# Patient Record
Sex: Male | Born: 1960 | Race: Black or African American | Hispanic: No | Marital: Married | Smoking: Never smoker
Health system: Southern US, Community
[De-identification: ages and names within clinical notes are randomized; demographics above are authoritative.]

## PROBLEM LIST (undated history)

## (undated) DIAGNOSIS — J45909 Unspecified asthma, uncomplicated: Secondary | ICD-10-CM

## (undated) DIAGNOSIS — T4145XA Adverse effect of unspecified anesthetic, initial encounter: Secondary | ICD-10-CM

## (undated) DIAGNOSIS — M1990 Unspecified osteoarthritis, unspecified site: Secondary | ICD-10-CM

## (undated) DIAGNOSIS — K219 Gastro-esophageal reflux disease without esophagitis: Secondary | ICD-10-CM

## (undated) DIAGNOSIS — G4730 Sleep apnea, unspecified: Secondary | ICD-10-CM

## (undated) DIAGNOSIS — T8859XA Other complications of anesthesia, initial encounter: Secondary | ICD-10-CM

## (undated) HISTORY — PX: NASAL SEPTUM SURGERY: SHX37

## (undated) HISTORY — PX: TONSILLECTOMY: SUR1361

## (undated) HISTORY — PX: ROTATOR CUFF REPAIR: SHX139

## (undated) HISTORY — PX: GREAT TOE ARTHRODESIS, INTERPHALANGEAL JOINT: SUR55

## (undated) HISTORY — PX: KNEE ARTHROSCOPY: SHX127

## (undated) HISTORY — PX: UVULOPALATOPHARYNGOPLASTY: SHX827

---

## 2011-12-27 ENCOUNTER — Emergency Department (HOSPITAL_BASED_OUTPATIENT_CLINIC_OR_DEPARTMENT_OTHER)
Admission: EM | Admit: 2011-12-27 | Discharge: 2011-12-27 | Disposition: A | Discharge status: Home / Self Care | Payer: Federal, State, Local not specified - PPO | Attending: Emergency Medicine | Admitting: Emergency Medicine

## 2011-12-27 ENCOUNTER — Encounter (HOSPITAL_BASED_OUTPATIENT_CLINIC_OR_DEPARTMENT_OTHER): Payer: Self-pay | Admitting: Student

## 2011-12-27 DIAGNOSIS — L03039 Cellulitis of unspecified toe: Secondary | ICD-10-CM | POA: Insufficient documentation

## 2011-12-27 DIAGNOSIS — L02619 Cutaneous abscess of unspecified foot: Secondary | ICD-10-CM | POA: Insufficient documentation

## 2011-12-27 DIAGNOSIS — Z79899 Other long term (current) drug therapy: Secondary | ICD-10-CM | POA: Insufficient documentation

## 2011-12-27 MED ORDER — SULFAMETHOXAZOLE-TRIMETHOPRIM 800-160 MG PO TABS: 1.0000 | ORAL_TABLET | Freq: Two times a day (BID) | ORAL | Status: DC

## 2011-12-27 MED ORDER — LIDOCAINE HCL (PF) 1 % IJ SOLN
INTRAMUSCULAR | Status: AC
  Administered 2011-12-27: 19:00:00
  Filled 2011-12-27: qty 5

## 2011-12-27 NOTE — ED Provider Notes (Signed)
History  This chart was scribed for Scott Sprout, MD by Ladona Ridgel Day. This patient was seen in room MH09/MH09 and the patient's care was started at 1707.   CSN: [id number]  Arrival date & time 12/27/11  1707   First MD Initiated Contact with Patient 12/27/11 1814      Chief Complaint  Patient presents with  . Toe Pain    digit 2 on left foot   The history is provided by the patient. No language interpreter was used.   Jadin Creque is a 51 y.o. male who presents to the Emergency Department complaining of constant pain to 3rd toe of his left foot which began last PM and worsened this AM when he woke up. He denies any injury to his toe or stubbing it on anything. He states pain w/bearing weight or any ROM of his toe. He has not taken any medicines for this problem yet.   No past medical history on file.  No past surgical history on file.  Family History  Problem Relation Age of Onset  . Hypertension Mother     History  Substance Use Topics  . Smoking status: Never Smoker   . Smokeless tobacco: Not on file  . Alcohol Use: Yes      Review of Systems A complete 10 system review of systems was obtained and all systems are negative except as noted in the HPI and PMH.   Allergies  Oxycontin  Home Medications   Current Outpatient Rx  Name Route Sig Dispense Refill  . BUDESONIDE-FORMOTEROL FUMARATE 160-4.5 MCG/ACT IN AERO Inhalation Inhale 2 puffs into the lungs 2 (two) times daily.      Triage vitals: BP 157/94  Pulse 93  Temp 97.9 F (36.6 C) (Oral)  Resp 20  Ht 5\' 11"  (1.803 m)  Wt 271 lb (122.925 kg)  BMI 37.80 kg/m2  SpO2 97%  Physical Exam  Nursing note and vitals reviewed. Constitutional: He is oriented to person, place, and time. He appears well-developed and well-nourished. No distress.  HENT:  Head: Normocephalic and atraumatic.  Eyes: EOM are normal.  Neck: Neck supple. No tracheal deviation present.  Cardiovascular: Normal rate.     Pulmonary/Chest: Effort normal. No respiratory distress.  Musculoskeletal: Normal range of motion.       Pain, swelling, induration on medial 3rd toe. Erythema and mild warmth over the toe.   Neurological: He is alert and oriented to person, place, and time.  Skin: Skin is warm and dry.  Psychiatric: He has a normal mood and affect. His behavior is normal.    ED Course  Procedures (including critical care time) DIAGNOSTIC STUDIES: Oxygen Saturation is 97% on room air, normal by my interpretation.    COORDINATION OF CARE: At 3 PM Discussed treatment plan with patient which includes [place] of 3rd toe left foot.. Patient agrees.   Labs Reviewed - No data to display No results found.  INCISION AND DRAINAGE Performed by: Scott Fletcher Consent: Verbal consent obtained. Risks and benefits: risks, benefits and alternatives were discussed Type: abscess  Body area: 3rd left toe  Anesthesia: local infiltration  Local anesthetic: lidocaine 1% without epinephrine  Anesthetic total: 1 ml  Complexity:simple Drainage: purulent  Drainage amount: scant Packing material: none Patient tolerance: Patient tolerated the procedure well with no immediate complications.     1. Cellulitis and abscess of foot       MDM   Patient with evidence of small toe abscess and surrounding cellulitis. 18-gauge needle inserted with  can't puffs. Patient placed on antibiotics.         Scott Sprout, MD 12/27/11 2259

## 2011-12-27 NOTE — ED Notes (Signed)
Patient C/O his left second toe To be painful.  Denies injury.

## 2011-12-27 NOTE — ED Notes (Signed)
Pt in with c/o pain and swelling to digit 2 on left foot.

## 2017-05-21 ENCOUNTER — Other Ambulatory Visit: Payer: Self-pay | Admitting: Orthopedic Surgery

## 2017-05-21 DIAGNOSIS — M25512 Pain in left shoulder: Secondary | ICD-10-CM

## 2017-05-29 ENCOUNTER — Ambulatory Visit
Admission: RE | Admit: 2017-05-29 | Discharge: 2017-05-29 | Disposition: A | Discharge status: Home / Self Care | Payer: Federal, State, Local not specified - PPO | Source: Ambulatory Visit | Attending: Orthopedic Surgery | Admitting: Orthopedic Surgery

## 2017-05-29 DIAGNOSIS — M25512 Pain in left shoulder: Secondary | ICD-10-CM

## 2017-08-03 ENCOUNTER — Other Ambulatory Visit: Payer: Self-pay

## 2017-08-03 ENCOUNTER — Encounter
Admission: RE | Admit: 2017-08-03 | Discharge: 2017-08-03 | Disposition: A | Discharge status: Home / Self Care | Payer: Federal, State, Local not specified - PPO | Source: Ambulatory Visit | Attending: Orthopedic Surgery | Admitting: Orthopedic Surgery

## 2017-08-03 DIAGNOSIS — Z01818 Encounter for other preprocedural examination: Secondary | ICD-10-CM | POA: Diagnosis present

## 2017-08-03 HISTORY — DX: Other complications of anesthesia, initial encounter: T88.59XA

## 2017-08-03 HISTORY — DX: Unspecified asthma, uncomplicated: J45.909

## 2017-08-03 HISTORY — DX: Adverse effect of unspecified anesthetic, initial encounter: T41.45XA

## 2017-08-03 HISTORY — DX: Gastro-esophageal reflux disease without esophagitis: K21.9

## 2017-08-03 LAB — BASIC METABOLIC PANEL
Anion gap: 9 (ref 5–15)
BUN: 17 mg/dL (ref 6–20)
CHLORIDE: 105 mmol/L (ref 101–111)
CO2: 27 mmol/L (ref 22–32)
CREATININE: 1.15 mg/dL (ref 0.61–1.24)
Calcium: 9.3 mg/dL (ref 8.9–10.3)
GFR calc Af Amer: >60 mL/min (ref >60)
GFR calc non Af Amer: >60 mL/min (ref >60)
Glucose, Bld: 109 mg/dL — ABNORMAL HIGH (ref 65–99)
POTASSIUM: 3.9 mmol/L (ref 3.5–5.1)
SODIUM: 141 mmol/L (ref 135–145)

## 2017-08-03 LAB — CBC
HEMATOCRIT: 39.9 % — AB (ref 40.0–52.0)
HEMOGLOBIN: 13.4 g/dL (ref 13.0–18.0)
MCH: 31.4 pg (ref 26.0–34.0)
MCHC: 33.7 g/dL (ref 32.0–36.0)
MCV: 93.3 fL (ref 80.0–100.0)
Platelets: 252 10*3/uL (ref 150–440)
RBC: 4.28 MIL/uL — ABNORMAL LOW (ref 4.40–5.90)
RDW: 14.3 % (ref 11.5–14.5)
WBC: 5.9 10*3/uL (ref 3.8–10.6)

## 2017-08-03 NOTE — Patient Instructions (Signed)
Your procedure is scheduled on: 08/10/17 Mon Report to Same Day Surgery 2nd floor medical mall [facility](Medical Mall Entrance-take elevator on left to 2nd floor.  Check in with surgery information desk.) To find out your arrival time please call [phone] between 1PM - 3PM on 08/07/17 Fri  Remember: Instructions that are not followed completely may result in serious medical risk, up to and including death, or upon the discretion of your surgeon and anesthesiologist your surgery may need to be rescheduled.    _x___ 1. Do not eat food after midnight the night before your procedure. You may drink clear liquids up to 2 hours before you are scheduled to arrive at the hospital for your procedure.  Do not drink clear liquids within 2 hours of your scheduled arrival to the hospital.  Clear liquids include  --Water or Apple juice without pulp  --Clear carbohydrate beverage such as ClearFast or Gatorade  --Black Coffee or Clear Tea (No milk, no creamers, do not add anything to                  the coffee or Tea Type 1 and type 2 diabetics should only drink water.  No gum chewing or hard candies.     __x__ 2. No Alcohol for 24 hours before or after surgery.   __x__3. No Smoking or e-cigarettes for 24 prior to surgery.  Do not use any chewable tobacco products for at least 6 hour prior to surgery   ____  4. Bring all medications with you on the day of surgery if instructed.    __x__ 5. Notify your doctor if there is any change in your medical condition     (cold, fever, infections).    x___6. On the morning of surgery brush your teeth with toothpaste and water.  You may rinse your mouth with mouth wash if you wish.  Do not swallow any toothpaste or mouthwash.   Do not wear jewelry, make-up, hairpins, clips or nail polish.  Do not wear lotions, powders, or perfumes. You may wear deodorant.  Do not shave 48 hours prior to surgery. Men may shave face and neck.  Do not bring valuables to the hospital.     [facility] is not responsible for any belongings or valuables.               Contacts, dentures or bridgework may not be worn into surgery.  Leave your suitcase in the car. After surgery it may be brought to your room.  For patients admitted to the hospital, discharge time is determined by your                       treatment team.  _  Patients discharged the day of surgery will not be allowed to drive home.  You will need someone to drive you home and stay with you the night of your procedure.    Please read over the following fact sheets that you were given:   [facility] Pre[facility] and or MRSA Information   _x___ Take anti-hypertensive listed below, cardiac, seizure, asthma,     anti-reflux and psychiatric medicines. These include:  1. budesonide-formoterol (SYMBICORT) 160-4.5 MCG/ACT inhaler  2.omeprazole (PRILOSEC) 10 MG capsule  3.  4.  5.  6.  ____Fleets enema or Magnesium Citrate as directed.   _x___ Use CHG Soap or sage wipes as directed on instruction sheet   ____ Use inhalers on the day of surgery  and bring to hospital day of surgery  ____ Stop Metformin and Janumet 2 days prior to surgery.    ____ Take 1/2 of usual insulin dose the night before surgery and none on the morning     surgery.   _x___ Follow recommendations from Cardiologist, Pulmonologist or PCP regarding          stopping Aspirin, Coumadin, Plavix ,Eliquis, Effient, or Pradaxa, and Pletal.  X____Stop Anti-inflammatories such as Advil, Aleve, Ibuprofen, Motrin, Naproxen, Naprosyn, Goodies powders or aspirin products. OK to take Tylenol and                          Celebrex.   _x___ Stop supplements until after surgery.  But may continue Vitamin D, Vitamin B,       and multivitamin.   ____ Bring C-Pap to the hospital.

## 2017-08-10 ENCOUNTER — Encounter: Admission: RE | Payer: Self-pay | Source: Ambulatory Visit

## 2017-08-10 ENCOUNTER — Ambulatory Visit
Admission: RE | Admit: 2017-08-10 | Payer: Federal, State, Local not specified - PPO | Source: Ambulatory Visit | Admitting: Orthopedic Surgery

## 2017-08-10 SURGERY — ARTHROSCOPY, SHOULDER WITH REPAIR, ROTATOR CUFF, OPEN
Anesthesia: Choice | Laterality: Left

## 2018-05-18 ENCOUNTER — Other Ambulatory Visit: Payer: Self-pay | Admitting: Orthopedic Surgery

## 2018-05-18 DIAGNOSIS — M25511 Pain in right shoulder: Secondary | ICD-10-CM

## 2018-05-20 ENCOUNTER — Other Ambulatory Visit: Payer: Self-pay | Admitting: Orthopedic Surgery

## 2018-05-20 DIAGNOSIS — M25511 Pain in right shoulder: Secondary | ICD-10-CM

## 2018-06-03 ENCOUNTER — Other Ambulatory Visit: Payer: Self-pay | Admitting: Orthopedic Surgery

## 2018-06-03 DIAGNOSIS — M25511 Pain in right shoulder: Secondary | ICD-10-CM

## 2018-06-07 ENCOUNTER — Ambulatory Visit
Admission: RE | Admit: 2018-06-07 | Discharge: 2018-06-07 | Disposition: A | Discharge status: Home / Self Care | Payer: Federal, State, Local not specified - PPO | Source: Ambulatory Visit | Attending: Orthopedic Surgery | Admitting: Orthopedic Surgery

## 2018-06-07 ENCOUNTER — Other Ambulatory Visit: Payer: Self-pay

## 2018-06-07 DIAGNOSIS — M25511 Pain in right shoulder: Secondary | ICD-10-CM | POA: Diagnosis present

## 2018-06-08 ENCOUNTER — Ambulatory Visit: Payer: Federal, State, Local not specified - PPO

## 2018-10-11 ENCOUNTER — Other Ambulatory Visit: Payer: Self-pay

## 2018-10-11 ENCOUNTER — Encounter: Payer: Self-pay | Admitting: *Deleted

## 2018-10-12 NOTE — Anesthesia Preprocedure Evaluation (Addendum)
Anesthesia Evaluation  Patient identified by MRN, date of birth, ID band Patient awake    Reviewed: Allergy & Precautions, NPO status , Patient's Chart, lab work & pertinent test results  History of Anesthesia Complications Negative for: history of anesthetic complications  Airway Mallampati: III   Neck ROM: Full    Dental no notable dental hx.    Pulmonary asthma , sleep apnea ,    Pulmonary exam normal breath sounds clear to auscultation       Cardiovascular Exercise Tolerance: Good negative cardio ROS Normal cardiovascular exam Rhythm:Regular Rate:Normal     Neuro/Psych negative neurological ROS     GI/Hepatic GERD  ,  Endo/Other  Obesity   Renal/GU negative Renal ROS     Musculoskeletal  (+) Arthritis ,   Abdominal   Peds  Hematology negative hematology ROS (+)   Anesthesia Other Findings   Reproductive/Obstetrics                            Anesthesia Physical Anesthesia Plan  ASA: II  Anesthesia Plan: General and Regional   Post-op Pain Management:  Regional for Post-op pain and GA combined w/ Regional for post-op pain   Induction: Intravenous  PONV Risk Score and Plan: 2 and Dexamethasone and Ondansetron  Airway Management Planned: LMA  Additional Equipment:   Intra-op Plan:   Post-operative Plan: Extubation in OR  Informed Consent: I have reviewed the patients History and Physical, chart, labs and discussed the procedure including the risks, benefits and alternatives for the proposed anesthesia with the patient or authorized representative who has indicated his/her understanding and acceptance.       Plan Discussed with: CRNA  Anesthesia Plan Comments:        Anesthesia Quick Evaluation

## 2018-10-19 ENCOUNTER — Other Ambulatory Visit: Payer: Self-pay

## 2018-10-19 ENCOUNTER — Other Ambulatory Visit
Admission: RE | Admit: 2018-10-19 | Discharge: 2018-10-19 | Disposition: A | Discharge status: Home / Self Care | Source: Ambulatory Visit | Attending: Orthopedic Surgery | Admitting: Orthopedic Surgery

## 2018-10-19 DIAGNOSIS — G4733 Obstructive sleep apnea (adult) (pediatric): Secondary | ICD-10-CM | POA: Diagnosis not present

## 2018-10-19 DIAGNOSIS — J45909 Unspecified asthma, uncomplicated: Secondary | ICD-10-CM | POA: Diagnosis not present

## 2018-10-19 DIAGNOSIS — M19011 Primary osteoarthritis, right shoulder: Secondary | ICD-10-CM | POA: Diagnosis not present

## 2018-10-19 DIAGNOSIS — Z7951 Long term (current) use of inhaled steroids: Secondary | ICD-10-CM | POA: Diagnosis not present

## 2018-10-19 DIAGNOSIS — M1990 Unspecified osteoarthritis, unspecified site: Secondary | ICD-10-CM | POA: Diagnosis not present

## 2018-10-19 DIAGNOSIS — Z888 Allergy status to other drugs, medicaments and biological substances status: Secondary | ICD-10-CM | POA: Diagnosis not present

## 2018-10-19 DIAGNOSIS — E669 Obesity, unspecified: Secondary | ICD-10-CM | POA: Diagnosis not present

## 2018-10-19 DIAGNOSIS — M7581 Other shoulder lesions, right shoulder: Secondary | ICD-10-CM | POA: Diagnosis not present

## 2018-10-19 DIAGNOSIS — K219 Gastro-esophageal reflux disease without esophagitis: Secondary | ICD-10-CM | POA: Diagnosis not present

## 2018-10-19 DIAGNOSIS — Z79899 Other long term (current) drug therapy: Secondary | ICD-10-CM | POA: Diagnosis not present

## 2018-10-19 DIAGNOSIS — Z885 Allergy status to narcotic agent status: Secondary | ICD-10-CM | POA: Diagnosis not present

## 2018-10-19 DIAGNOSIS — Z6835 Body mass index (BMI) 35.0-35.9, adult: Secondary | ICD-10-CM | POA: Diagnosis not present

## 2018-10-19 DIAGNOSIS — M7541 Impingement syndrome of right shoulder: Secondary | ICD-10-CM | POA: Diagnosis not present

## 2018-10-19 DIAGNOSIS — M75111 Incomplete rotator cuff tear or rupture of right shoulder, not specified as traumatic: Secondary | ICD-10-CM | POA: Diagnosis present

## 2018-10-19 DIAGNOSIS — Z20828 Contact with and (suspected) exposure to other viral communicable diseases: Secondary | ICD-10-CM | POA: Diagnosis not present

## 2018-10-19 LAB — SARS CORONAVIRUS 2 (TAT 6-24 HRS): SARS Coronavirus 2: NEGATIVE

## 2018-10-22 ENCOUNTER — Ambulatory Visit: Admitting: Anesthesiology

## 2018-10-22 ENCOUNTER — Encounter
Admission: RE | Disposition: A | Discharge status: Home / Self Care | Payer: Self-pay | Source: Home / Self Care | Attending: Orthopedic Surgery

## 2018-10-22 ENCOUNTER — Ambulatory Visit
Admission: RE | Admit: 2018-10-22 | Discharge: 2018-10-22 | Disposition: A | Discharge status: Home / Self Care | Attending: Orthopedic Surgery | Admitting: Orthopedic Surgery

## 2018-10-22 DIAGNOSIS — Z888 Allergy status to other drugs, medicaments and biological substances status: Secondary | ICD-10-CM | POA: Insufficient documentation

## 2018-10-22 DIAGNOSIS — Z79899 Other long term (current) drug therapy: Secondary | ICD-10-CM | POA: Insufficient documentation

## 2018-10-22 DIAGNOSIS — K219 Gastro-esophageal reflux disease without esophagitis: Secondary | ICD-10-CM | POA: Insufficient documentation

## 2018-10-22 DIAGNOSIS — J45909 Unspecified asthma, uncomplicated: Secondary | ICD-10-CM | POA: Insufficient documentation

## 2018-10-22 DIAGNOSIS — M75111 Incomplete rotator cuff tear or rupture of right shoulder, not specified as traumatic: Secondary | ICD-10-CM | POA: Insufficient documentation

## 2018-10-22 DIAGNOSIS — Z6835 Body mass index (BMI) 35.0-35.9, adult: Secondary | ICD-10-CM | POA: Insufficient documentation

## 2018-10-22 DIAGNOSIS — M7581 Other shoulder lesions, right shoulder: Secondary | ICD-10-CM | POA: Insufficient documentation

## 2018-10-22 DIAGNOSIS — M7541 Impingement syndrome of right shoulder: Secondary | ICD-10-CM | POA: Insufficient documentation

## 2018-10-22 DIAGNOSIS — Z7951 Long term (current) use of inhaled steroids: Secondary | ICD-10-CM | POA: Insufficient documentation

## 2018-10-22 DIAGNOSIS — M19011 Primary osteoarthritis, right shoulder: Secondary | ICD-10-CM | POA: Insufficient documentation

## 2018-10-22 DIAGNOSIS — Z885 Allergy status to narcotic agent status: Secondary | ICD-10-CM | POA: Insufficient documentation

## 2018-10-22 DIAGNOSIS — E669 Obesity, unspecified: Secondary | ICD-10-CM | POA: Insufficient documentation

## 2018-10-22 DIAGNOSIS — M1990 Unspecified osteoarthritis, unspecified site: Secondary | ICD-10-CM | POA: Insufficient documentation

## 2018-10-22 DIAGNOSIS — G4733 Obstructive sleep apnea (adult) (pediatric): Secondary | ICD-10-CM | POA: Insufficient documentation

## 2018-10-22 DIAGNOSIS — Z20828 Contact with and (suspected) exposure to other viral communicable diseases: Secondary | ICD-10-CM | POA: Insufficient documentation

## 2018-10-22 HISTORY — DX: Sleep apnea, unspecified: G47.30

## 2018-10-22 HISTORY — DX: Unspecified osteoarthritis, unspecified site: M19.90

## 2018-10-22 HISTORY — PX: SHOULDER ARTHROSCOPY WITH SUBACROMIAL DECOMPRESSION AND BICEP TENDON REPAIR: SHX5689

## 2018-10-22 SURGERY — SHOULDER ARTHROSCOPY WITH SUBACROMIAL DECOMPRESSION AND BICEP TENDON REPAIR
Anesthesia: Regional | Laterality: Right

## 2018-10-22 MED ORDER — ASPIRIN EC 325 MG PO TBEC: 325.0000 mg | DELAYED_RELEASE_TABLET | Freq: Every day | ORAL | 0 refills | Status: AC

## 2018-10-22 MED ORDER — LACTATED RINGERS IV SOLN: 100.0000 mL/h | INTRAVENOUS | Status: DC

## 2018-10-22 MED ORDER — BUPIVACAINE LIPOSOME 1.3 % IJ SUSP
INTRAMUSCULAR | Status: DC | PRN
  Administered 2018-10-22: 20 mL

## 2018-10-22 MED ORDER — PROMETHAZINE HCL 25 MG/ML IJ SOLN: 6.2500 mg | INTRAMUSCULAR | Status: DC | PRN

## 2018-10-22 MED ORDER — LACTATED RINGERS IV SOLN
INTRAVENOUS | Status: DC | PRN
  Administered 2018-10-22: 4 mL

## 2018-10-22 MED ORDER — OXYCODONE HCL 5 MG PO TABS
5.0000 mg | ORAL_TABLET | Freq: Once | ORAL | Status: AC | PRN
  Administered 2018-10-22: 5 mg via ORAL

## 2018-10-22 MED ORDER — ONDANSETRON HCL 4 MG/2ML IJ SOLN
INTRAMUSCULAR | Status: DC | PRN
  Administered 2018-10-22: 4 mg via INTRAVENOUS

## 2018-10-22 MED ORDER — FENTANYL CITRATE (PF) 100 MCG/2ML IJ SOLN
INTRAMUSCULAR | Status: DC | PRN
  Administered 2018-10-22 (×2): 25 ug via INTRAVENOUS
  Administered 2018-10-22: 50 ug via INTRAVENOUS
  Administered 2018-10-22 (×2): 25 ug via INTRAVENOUS

## 2018-10-22 MED ORDER — LIDOCAINE HCL (CARDIAC) PF 100 MG/5ML IV SOSY
PREFILLED_SYRINGE | INTRAVENOUS | Status: DC | PRN
  Administered 2018-10-22: 40 mg via INTRATRACHEAL

## 2018-10-22 MED ORDER — MIDAZOLAM HCL 5 MG/5ML IJ SOLN
INTRAMUSCULAR | Status: DC | PRN
  Administered 2018-10-22: 2 mg via INTRAVENOUS

## 2018-10-22 MED ORDER — HYDROMORPHONE HCL 1 MG/ML IJ SOLN
0.2500 mg | INTRAMUSCULAR | Status: DC | PRN
  Administered 2018-10-22 (×3): 0.25 mg via INTRAVENOUS
  Administered 2018-10-22: 0.5 mg via INTRAVENOUS

## 2018-10-22 MED ORDER — OXYCODONE HCL 5 MG/5ML PO SOLN: 5.0000 mg | Freq: Once | ORAL | Status: AC | PRN

## 2018-10-22 MED ORDER — ACETAMINOPHEN 500 MG PO TABS: 1000.0000 mg | ORAL_TABLET | Freq: Three times a day (TID) | ORAL | 2 refills | Status: AC

## 2018-10-22 MED ORDER — GLYCOPYRROLATE 0.2 MG/ML IJ SOLN
INTRAMUSCULAR | Status: DC | PRN
  Administered 2018-10-22: 0.1 mg via INTRAVENOUS

## 2018-10-22 MED ORDER — MEPERIDINE HCL 25 MG/ML IJ SOLN: 6.2500 mg | INTRAMUSCULAR | Status: DC | PRN

## 2018-10-22 MED ORDER — ONDANSETRON 4 MG PO TBDP: 4.0000 mg | ORAL_TABLET | Freq: Three times a day (TID) | ORAL | 0 refills | Status: DC | PRN

## 2018-10-22 MED ORDER — DEXAMETHASONE SODIUM PHOSPHATE 4 MG/ML IJ SOLN
INTRAMUSCULAR | Status: DC | PRN
  Administered 2018-10-22: 4 mg via INTRAVENOUS

## 2018-10-22 MED ORDER — BUPIVACAINE HCL (PF) 0.5 % IJ SOLN
INTRAMUSCULAR | Status: DC | PRN
  Administered 2018-10-22: 10 mL

## 2018-10-22 MED ORDER — HYDROCODONE-ACETAMINOPHEN 5-325 MG PO TABS: 1.0000 | ORAL_TABLET | ORAL | 0 refills | Status: DC | PRN

## 2018-10-22 MED ORDER — CEFAZOLIN SODIUM-DEXTROSE 2-4 GM/100ML-% IV SOLN
2.0000 g | Freq: Once | INTRAVENOUS | Status: AC
  Administered 2018-10-22: 2 g via INTRAVENOUS

## 2018-10-22 MED ORDER — LACTATED RINGERS IV SOLN
INTRAVENOUS | Status: DC
  Administered 2018-10-22: 08:00:00 via INTRAVENOUS

## 2018-10-22 MED ORDER — PROPOFOL 10 MG/ML IV BOLUS
INTRAVENOUS | Status: DC | PRN
  Administered 2018-10-22: 50 mg via INTRAVENOUS
  Administered 2018-10-22: 150 mg via INTRAVENOUS

## 2018-10-22 SURGICAL SUPPLY — 67 items
ADAPTER IRRIG TUBE 2 SPIKE SOL (ADAPTER) ×4 IMPLANT
ANCHOR BONE REGENETEN (Anchor) ×1 IMPLANT
ANCHOR SUT BIOCOMP LK 2.9X12.5 (Anchor) ×1 IMPLANT
ANCHOR TENDON REGENETEN (Staple) ×1 IMPLANT
BUR BR 5.5 12 FLUTE (BURR) ×1 IMPLANT
BUR RADIUS 4.0X18.5 (BURR) ×1 IMPLANT
BUR RADIUS 5.5 (BURR) ×2 IMPLANT
CANNULA PART THRD DISP 5.75X7 (CANNULA) ×1 IMPLANT
CANNULA PARTIAL THREAD 2X7 (CANNULA) ×3 IMPLANT
CHLORAPREP W/TINT 26 (MISCELLANEOUS) ×2 IMPLANT
COOLER POLAR GLACIER W/PUMP (MISCELLANEOUS) ×2 IMPLANT
COVER LIGHT HANDLE UNIVERSAL (MISCELLANEOUS) ×4 IMPLANT
DERMABOND ADVANCED (GAUZE/BANDAGES/DRESSINGS) ×1
DERMABOND ADVANCED .7 DNX12 (GAUZE/BANDAGES/DRESSINGS) ×1 IMPLANT
DRAPE IMP U-DRAPE 54X76 (DRAPES) ×4 IMPLANT
DRAPE INCISE IOBAN 66X45 STRL (DRAPES) ×2 IMPLANT
DRAPE SHEET LG 3/4 BI-LAMINATE (DRAPES) ×2 IMPLANT
DRAPE U-SHAPE 48X52 POLY STRL (PACKS) ×3 IMPLANT
DRSG TEGADERM 4X4.75 (GAUZE/BANDAGES/DRESSINGS) ×6 IMPLANT
ELECT REM PT RETURN 9FT ADLT (ELECTROSURGICAL) ×2
ELECTRODE REM PT RTRN 9FT ADLT (ELECTROSURGICAL) ×1 IMPLANT
GAUZE SPONGE 4X4 12PLY STRL (GAUZE/BANDAGES/DRESSINGS) ×2 IMPLANT
GAUZE XEROFORM 1X8 LF (GAUZE/BANDAGES/DRESSINGS) ×1 IMPLANT
GLOVE BIO SURGEON STRL SZ7.5 (GLOVE) ×6 IMPLANT
GLOVE BIOGEL PI IND STRL 8 (GLOVE) ×1 IMPLANT
GLOVE BIOGEL PI INDICATOR 8 (GLOVE) ×3
GOWN STRL REIN 2XL XLG LVL4 (GOWN DISPOSABLE) ×2 IMPLANT
GOWN STRL REUS W/ TWL LRG LVL3 (GOWN DISPOSABLE) ×1 IMPLANT
GOWN STRL REUS W/TWL LRG LVL3 (GOWN DISPOSABLE) ×3
IMPL REGENETEN MEDIUM (Shoulder) IMPLANT
IMPLANT REGENETEN MEDIUM (Shoulder) ×2 IMPLANT
IV LACTATED RINGER IRRG 3000ML (IV SOLUTION) ×8
IV LR IRRIG 3000ML ARTHROMATIC (IV SOLUTION) ×8 IMPLANT
KIT DISPOSABLE PUSHLOCK 2.9MM (KITS) ×1 IMPLANT
KIT STABILIZATION SHOULDER (MISCELLANEOUS) ×2 IMPLANT
KIT SUTURETAK 3.0 INSERT PERC (KITS) IMPLANT
KIT TURNOVER KIT A (KITS) ×2 IMPLANT
MANIFOLD NEPTUNE II (INSTRUMENTS) ×2 IMPLANT
MASK FACE SPIDER DISP (MASK) ×2 IMPLANT
MAT GRAY ABSORB FLUID 28X50 (MISCELLANEOUS) ×4 IMPLANT
NDL SAFETY ECLIPSE 18X1.5 (NEEDLE) ×1 IMPLANT
NDL SCORPION MULTI FIRE (NEEDLE) IMPLANT
NEEDLE HYPO 18GX1.5 SHARP (NEEDLE) ×1
NEEDLE SCORPION MULTI FIRE (NEEDLE) ×2 IMPLANT
PACK ARTHROSCOPY SHOULDER (MISCELLANEOUS) ×2 IMPLANT
PAD WRAPON POLAR SHDR UNIV (MISCELLANEOUS) ×1 IMPLANT
PENCIL SMOKE EVACUATOR (MISCELLANEOUS) ×2 IMPLANT
SET TUBE SUCT SHAVER OUTFL 24K (TUBING) ×2 IMPLANT
SET TUBE TIP INTRA-ARTICULAR (MISCELLANEOUS) ×2 IMPLANT
SPONGE GAUZE 2X2 8PLY STRL LF (GAUZE/BANDAGES/DRESSINGS) ×6 IMPLANT
SPONGE LAP 18X18 RF (DISPOSABLE) ×1 IMPLANT
SUT ETHILON 3-0 FS-10 30 BLK (SUTURE) ×2
SUT LASSO 90 DEG SD STR (SUTURE) IMPLANT
SUT MNCRL 4-0 (SUTURE) ×1
SUT MNCRL 4-0 27XMFL (SUTURE) ×1
SUT PDSII 0 (SUTURE) ×1 IMPLANT
SUT VIC AB 0 CT1 36 (SUTURE) ×2 IMPLANT
SUT VIC AB 2-0 CT2 27 (SUTURE) ×1 IMPLANT
SUTURE EHLN 3-0 FS-10 30 BLK (SUTURE) ×1 IMPLANT
SUTURE MNCRL 4-0 27XMF (SUTURE) ×1 IMPLANT
SUTURE TAPE FIBERLINK 1.3 LOOP (SUTURE) IMPLANT
SUTURETAPE FIBERLINK 1.3 LOOP (SUTURE) ×2
SYR 10ML LL (SYRINGE) ×2 IMPLANT
TAPE MICROFOAM 4IN (TAPE) ×1 IMPLANT
TUBING ARTHRO INFLOW-ONLY STRL (TUBING) ×2 IMPLANT
WAND WEREWOLF FLOW 90D (MISCELLANEOUS) ×2 IMPLANT
WRAPON POLAR PAD SHDR UNIV (MISCELLANEOUS) ×2

## 2018-10-22 NOTE — Anesthesia Procedure Notes (Signed)
Procedure Name: LMA Insertion Date/Time: 10/22/2018 8:55 AM Performed by: Mayme Genta, CRNA Pre-anesthesia Checklist: Patient identified, Emergency Drugs available, Suction available, Timeout performed and Patient being monitored Patient Re-evaluated:Patient Re-evaluated prior to induction Oxygen Delivery Method: Circle system utilized Preoxygenation: Pre-oxygenation with 100% oxygen Induction Type: IV induction LMA: LMA inserted LMA Size: 4.0 Number of attempts: 1 Placement Confirmation: positive ETCO2 and breath sounds checked- equal and bilateral Tube secured with: Tape

## 2018-10-22 NOTE — H&P (Signed)
Paper H&P to be scanned into permanent record. H&P reviewed. No significant changes noted.  

## 2018-10-22 NOTE — Transfer of Care (Signed)
Immediate Anesthesia Transfer of Care Note  Patient: Scott Fletcher  Procedure(s) Performed: SHOULDER ARTHROSCOPY WITH SUBACROMIAL DECOMPRESSION, DISTAL CLAVICLE EXCISION AND OPEN BICEPS TENODESIS WITH  MINI OPEN REGENETEN PATCH APPLICATION (Right )  Patient Location: PACU  Anesthesia Type: General, Regional  Level of Consciousness: awake, alert  and patient cooperative  Airway and Oxygen Therapy: Patient Spontanous Breathing and Patient connected to supplemental oxygen  Post-op Assessment: Post-op Vital signs reviewed, Patient's Cardiovascular Status Stable, Respiratory Function Stable, Patent Airway and No signs of Nausea or vomiting  Post-op Vital Signs: Reviewed and stable  Complications: No apparent anesthesia complications

## 2018-10-22 NOTE — Anesthesia Postprocedure Evaluation (Signed)
Anesthesia Post Note  Patient: Scott Fletcher  Procedure(s) Performed: SHOULDER ARTHROSCOPY WITH SUBACROMIAL DECOMPRESSION, DISTAL CLAVICLE EXCISION AND OPEN BICEPS TENODESIS WITH  MINI OPEN REGENETEN PATCH APPLICATION (Right )  Patient location during evaluation: PACU Anesthesia Type: Regional Level of consciousness: awake and alert, oriented and patient cooperative Pain management: pain level controlled Vital Signs Assessment: post-procedure vital signs reviewed and stable Respiratory status: spontaneous breathing, nonlabored ventilation and respiratory function stable Cardiovascular status: blood pressure returned to baseline and stable Postop Assessment: adequate PO intake Anesthetic complications: no    Darrin Nipper

## 2018-10-22 NOTE — Discharge Instructions (Signed)
Post-Op Instructions - Regeneten Patch  1. Bracing: You will wear a shoulder immobilizer or sling for 1 week and then can wean as tolerated.   2. Driving: No driving for at least 2 weeks post-op.   3. Activity: Progress to motion as tolerated, moving from passive to active-assisted to active motion. For the first 4 weeks, forward flexion is limited to 100. External rotation with the arm by the side is allowed, but abduction-external rotation is not allowed for the first 6 weeks. After 6 weeks, no restrictions on motion or arm use. Return to normal activities normally takes 4-6 months on average. If rehab goes very well, may be able to do most activities at 4 months, except overhead or contact sports.  4. Physical Therapy: Begins 3-4 days after surgery  5. Medications:  - You will be provided a prescription for narcotic pain medicine. After surgery, take 1-2 narcotic tablets every 4 hours if needed for severe pain.  - A prescription for anti-nausea medication will be provided in case the narcotic medicine causes nausea - take 1 tablet every 6 hours only if nauseated.   - Take tylenol 1000 mg (2 Extra Strength tablets or 3 regular strength) every 8 hours for pain.  May decrease or stop tylenol 5 days after surgery if you are having minimal pain. - Take ASA 325mg /day x 2 weeks to help prevent DVTs/PEs (blood clots).  - DO NOT take ANY nonsteroidal anti-inflammatory pain medications (Advil, Motrin, Ibuprofen, Aleve, Naproxen, or Naprosyn). These medicines can inhibit healing of your shoulder repair.    If you are taking prescription medication for anxiety, depression, insomnia, muscle spasm, chronic pain, or for attention deficit disorder, you are advised that you are at a higher risk of adverse effects with use of narcotics post-op, including narcotic addiction/dependence, depressed breathing, death. If you use non-prescribed substances: alcohol, marijuana, cocaine, heroin, methamphetamines, etc.,  you are at a higher risk of adverse effects with use of narcotics post-op, including narcotic addiction/dependence, depressed breathing, death. You are advised that taking > 50 morphine milligram equivalents (MME) of narcotic pain medication per day results in twice the risk of overdose or death. For your prescription provided: oxycodone 5 mg - taking more than 6 tablets per day would result in > 50 morphine milligram equivalents (MME) of narcotic pain medication. Be advised that we will prescribe narcotics short-term, for acute post-operative pain only - 3 weeks for major operations such as shoulder repair/reconstruction surgeries.     6. Post-Op Appointment:  Your first post-op appointment will be 10-14 days post-op.  7. Work or School: For most, but not all procedures, we advise staying out of work or school for at least 1 to 2 weeks in order to recover from the stress of surgery and to allow time for healing.   If you need a work or school note this can be provided.   8. Smoking: If you are a smoker, you need to refrain from smoking in the postoperative period. The nicotine in cigarettes will inhibit healing of your shoulder repair and decrease the chance of successful repair. Similarly, nicotine containing products (gum, patches) should be avoided.   Post-operative Brace: Apply and remove the brace you received as you were instructed to at the time of fitting and as described in detail as the braces instructions for use indicate.  Wear the brace for the period of time prescribed by your physician.  The brace can be cleaned with soap and water and allowed to air  dry only.  Should the brace result in increased pain, decreased feeling (numbness/tingling), increased swelling or an overall worsening of your medical condition, please contact your doctor immediately.  If an emergency situation occurs as a result of wearing the brace after normal business hours, please dial 911 and seek immediate  medical attention.  Let your doctor know if you have any further questions about the brace issued to you. Refer to the shoulder sling instructions for use if you have any questions regarding the correct fit of your shoulder sling.  [facility] Customer Care for Troubleshooting: [phone]  Video that illustrates how to properly use a shoulder sling: "Instructions for Proper Use of an Orthopaedic Sling" http://bass.com/https://www.youtube.com/watch?v=AHZpn_Xo45w        Information for Discharge Teaching: EXPAREL (bupivacaine liposome injectable suspension)   Your surgeon or anesthesiologist gave you EXPAREL(bupivacaine) to help control your pain after surgery.   EXPAREL is a local anesthetic that provides pain relief by numbing the tissue around the surgical site.  EXPAREL is designed to release pain medication over time and can control pain for up to 72 hours.  Depending on how you respond to EXPAREL, you may require less pain medication during your recovery.  Possible side effects:  Temporary loss of sensation or ability to move in the area where bupivacaine was injected.  Nausea, vomiting, constipation  Rarely, numbness and tingling in your mouth or lips, lightheadedness, or anxiety may occur.  Call your doctor right away if you think you may be experiencing any of these sensations, or if you have other questions regarding possible side effects.  Follow all other discharge instructions given to you by your surgeon or nurse. Eat a healthy diet and drink plenty of water or other fluids.  If you return to the hospital for any reason within 96 hours following the administration of EXPAREL, it is important for health care providers to know that you have received this anesthetic. A teal colored band has been placed on your arm with the date, time and amount of EXPAREL you have received in order to alert and inform your health care providers. Please leave this armband in place for the full 96 hours  following administration, and then you may remove the band.  General Anesthesia, Adult, Care After This sheet gives you information about how to care for yourself after your procedure. Your health care provider may also give you more specific instructions. If you have problems or questions, contact your health care provider. What can I expect after the procedure? After the procedure, the following side effects are common:  Pain or discomfort at the IV site.  Nausea.  Vomiting.  Sore throat.  Trouble concentrating.  Feeling cold or chills.  Weak or tired.  Sleepiness and fatigue.  Soreness and body aches. These side effects can affect parts of the body that were not involved in surgery. Follow these instructions at home:  For at least 24 hours after the procedure:  Have a responsible adult stay with you. It is important to have someone help care for you until you are awake and alert.  Rest as needed.  Do not: ? Participate in activities in which you could fall or become injured. ? Drive. ? Use heavy machinery. ? Drink alcohol. ? Take sleeping pills or medicines that cause drowsiness. ? Make important decisions or sign legal documents. ? Take care of children on your own. Eating and drinking  Follow any instructions from your health care provider about eating or drinking restrictions.  When you feel hungry, start by eating small amounts of foods that are soft and easy to digest (bland), such as toast. Gradually return to your regular diet.  Drink enough fluid to keep your urine pale yellow.  If you vomit, rehydrate by drinking water, juice, or clear broth. General instructions  If you have sleep apnea, surgery and certain medicines can increase your risk for breathing problems. Follow instructions from your health care provider about wearing your sleep device: ? Anytime you are sleeping, including during daytime naps. ? While taking prescription pain medicines,  sleeping medicines, or medicines that make you drowsy.  Return to your normal activities as told by your health care provider. Ask your health care provider what activities are safe for you.  Take over-the-counter and prescription medicines only as told by your health care provider.  If you smoke, do not smoke without supervision.  Keep all follow-up visits as told by your health care provider. This is important. Contact a health care provider if:  You have nausea or vomiting that does not get better with medicine.  You cannot eat or drink without vomiting.  You have pain that does not get better with medicine.  You are unable to pass urine.  You develop a skin rash.  You have a fever.  You have redness around your IV site that gets worse. Get help right away if:  You have difficulty breathing.  You have chest pain.  You have blood in your urine or stool, or you vomit blood. Summary  After the procedure, it is common to have a sore throat or nausea. It is also common to feel tired.  Have a responsible adult stay with you for the first 24 hours after general anesthesia. It is important to have someone help care for you until you are awake and alert.  When you feel hungry, start by eating small amounts of foods that are soft and easy to digest (bland), such as toast. Gradually return to your regular diet.  Drink enough fluid to keep your urine pale yellow.  Return to your normal activities as told by your health care provider. Ask your health care provider what activities are safe for you. This information is not intended to replace advice given to you by your health care provider. Make sure you discuss any questions you have with your health care provider. Document Released: 05/26/2000 Document Revised: 02/20/2017 Document Reviewed: 10/03/2016 Elsevier Patient Education  2020 [employer].

## 2018-10-22 NOTE — Anesthesia Procedure Notes (Signed)
Anesthesia Regional Block: Interscalene brachial plexus block   Pre-Anesthetic Checklist: ,, timeout performed, Correct Patient, Correct Site, Correct Laterality, Correct Procedure, Correct Position, site marked, Risks and benefits discussed,  Surgical consent,  Pre-op evaluation,  At surgeon's request and post-op pain management  Laterality: Right  Prep: chloraprep       Needles:  Injection technique: Single-shot  Needle Type: Stimiplex     Needle Length: 10cm  Needle Gauge: 21     Additional Needles:   Procedures:,,,, ultrasound used (permanent image in chart),,,,  Narrative:  Start time: 10/22/2018 7:54 AM End time: 10/22/2018 8:02 AM Injection made incrementally with aspirations every 5 mL.  Performed by: Personally  Anesthesiologist: Darrin Nipper, MD  Additional Notes: Functioning IV was confirmed and monitors applied. Ultrasound guidance: relevant anatomy identified, needle position confirmed, local anesthetic spread visualized around nerve(s), vascular puncture avoided.  Image printed for medical record.  Negative aspiration and no paresthesias; incremental administration of local anesthetic.  Total Exparel 75ml and bupivacaine 0.5% 1ml injected in interscalene distribution.  The patient tolerated the procedure well.  Vitals signs recorded in RN notes.

## 2018-10-22 NOTE — Progress Notes (Signed)
Assisted [name], ANMD with right, ultrasound guided, supraclavicular block. Side rails up, monitors on throughout procedure. See vital signs in flow sheet. Tolerated Procedure well.  

## 2018-10-22 NOTE — Op Note (Addendum)
SURGERY DATE: 10/22/2018  PRE-OP DIAGNOSIS:  1. Right subacromial impingement 2. Right biceps tendinopathy 3. Right partial thickness rotator cuff tear 4. Right acromioclavicular joint osteoarthritis  POST-OP DIAGNOSIS: 1. Right subacromial impingement 2. Right biceps tendinopathy 3. Right partial thickness rotator cuff tear 4. Right acromioclavicular joint osteoarthritis  PROCEDURES:  1. Right mini-open rotator cuff repair with Regeneten patch 2. Right arthroscopic biceps tenodesis 3. Right arthroscopic distal clavicle excision 4. Right arthroscopic extensive debridement of shoulder (glenohumeral and subacromial spaces) 5. Right arthroscopic subacromial decompression  SURGEON: Rosealee AlbeeSunny H. Kindred Heying, MD  ASSISTANT: Sonny DandyJ. Todd Mundy, PA  ANESTHESIA: Gen with interscalene block w/Exparil  ESTIMATED BLOOD LOSS: 25cc  DRAINS:  none  TOTAL IV FLUIDS: per anesthesia   SPECIMENS: none  IMPLANTS:  - [employer] Regeneten patch with associated tendon and bone staples - Arthrex 2.719mm PushLock anchor  OPERATIVE FINDINGS:  Examination under anesthesia: A careful examination under anesthesia was performed.  Passive range of motion was: FF: 150; ER at side: 50; ER in abduction: 90; IR in abduction: 50.  Anterior load shift: NT.  Posterior load shift: NT.  Sulcus in neutral: NT.  Sulcus in ER: NT.    Intra-operative findings: A thorough arthroscopic examination of the shoulder was performed.  The findings are: 1. Biceps tendon: tendinopathy with erythema 2. Superior labrum: Degenerative 3. Posterior labrum and capsule: normal 4. Inferior capsule and inferior recess: normal 5. Glenoid cartilage surface: Normal 6. Supraspinatus attachment: partial thickness tearing of the posterior supraspinatus extending into the infraspinatus 7. Posterior rotator cuff attachment: Partial-thickness tearing of the anterior infraspinatus measuring approximately 40% of the articular side. 8. Humeral head  articular cartilage: normal 9. Rotator interval: Significant synovitis 10: Subscapularis tendon: Normal 11. Anterior labrum: degenerative 12. IGHL: normal  OPERATIVE REPORT:   Indications for procedure: Harlon DittyDarrell L Dodds is a 58 y.o. year old male with ~5 months of shoulder pain that began after he attempted to throw a heavy object at work. He has has failed nonoperative management including rest, activity modification, physical therapy, and/or corticosteroid injection. MRI and clinical exam are concerning for a partial thickness rotator cuff tear with significant AC joint degenerative changes, subacromial impingement, and biceps tendinopathy. After discussion of risks, benefits, and alternatives to surgery, the patient elected to proceed with above mentioned procedure. The patient understands that use of the Regeneten patch is relatively new and long-term data is unknown.  Procedure in detail:  I identified Harlon Dittyarrell L Moscoso in the pre-operative holding area.  I marked the operative shoulder with my initials. I reviewed the risks and benefits of the proposed surgical intervention, and the patient (and/or patient's guardian) wished to proceed.  Anesthesia was then performed with an interscalene block with Exparil.  The patient was transferred to the operative suite and placed in the beach chair position.    SCDs were placed on the lower extremities. Appropriate IV antibiotics were administered. The operative upper extremity was then prepped and draped in standard fashion. A time out was performed confirming the correct extremity, correct patient and correct procedure.   I then created a standard posterior portal with an 11 blade. The glenohumeral joint was easily entered with a blunt trochar and the arthroscope introduced. The findings of diagnostic arthroscopy are described above. I debrided the degenerative anterior labrum and also debrided and coagulated the inflamed synovium to obtain hemostasis  and reduce the risk of post-operative swelling using an Arthrocare radiofrequency device. A spinal needle was placed in the anterior portion of the  partial thickness rotator cuff tear and an 0-PDS suture was passed through the needle and retrieved out of the anterior portal. This was repeated to mark the posterior position of the partial thickness rotator cuff tear.    I then turned my attention to the arthroscopic biceps tenodesis. I used the loop n tack technique to pass a fiber tape through the biceps in a locked fashion adjacent to the biceps anchor.  The biceps tendon was cut using arthroscopic scissors at its insertion on the superior labrum.  A hole for a 2.9 mm Arthrex PushLock was drilled in the bicipital groove just superior to the subscapularis tendon insertion.  The fiber tape was loaded onto the push lock anchor and impacted into place into the previously drilled hole in the bicipital groove.  This appropriately secured the biceps into the bicipital groove and took it off of tension.  Next, the arthroscope was then introduced into the subacromial space. A direct lateral portal was created with an 11-blade after spinal needle localization. An extensive subacromial bursectomy was performed using a combination of the shaver and Arthrocare wand. The entire acromial undersurface was exposed and the CA ligament was subperiosteally elevated to expose the prominent anterior acromial hook. A burr was used to create a flat anterior and lateral aspect of the acromion, converting it from a Type 2 to a Type 1 acromion. Care was made to keep the deltoid fascia intact.  Next, I exposed the acromioclavicular joint using a combination of shaver and arthrocare wand. The distal 10mm of clavicle was removed using a 5.630mm burr (2 burr widths removed). Adequate resection was confirmed by placing the camera into the anterior portal and by using a probe to measure the distance between the acromion and distal clavicle.  Care was taken to preserve the superior and posterior capsule. Hemostasis was achieved and fluid was evacuated from the shoulder.   A longitudinal incision from the anterolateral acromion ~7cm in length was made overlying the raphe between the anterior and middle heads of the deltoid. The raphe was identified and it was incised. The subacromial space was identified. Any remaining bursa was excised. The arm was then internally rotated.  The bursal side of the rotator cuff was intact, and there was no full-thickness tear. We decided to proceed with Regeneten patch placement. The Regeneten patch delivery gun was placed appropriately and the patch was delivered over the infraspinatus and supraspinatus tendons. It was positioned such that all areas of partial-thickness rotator cuff tear were covered. Tendon staples were placed medially, anteriorly, and posteriorly. Two bone staples were then placed laterally. The patch was then probed to confirm appropriate stability.   The wound was thoroughly irrigated.  The deltoid split was closed with 0 Vicryl.  The subdermal layer was closed with 2-0 Vicryl.  The skin was closed with 4-0 Monocryl and Dermabond. The portals were closed with 3-0 Nylon. Xeroform was applied to the portals. A sterile dressing was applied, followed by a Polar Care sleeve and a SlingShot shoulder immobilizer/sling. The patient awoke from anesthesia without difficulty and was transferred to the PACU in stable condition.    Of note, assistance from a PA was essential to performing the surgery. PA assisted with patient positioning, retraction, and instrumentation. The surgery would have been more difficult and had longer operative time without PA assistance.   COMPLICATIONS: none  DISPOSITION: plan for discharge home after recovery in PACU  POSTOPERATIVE PLAN: ASA for DVT ppx. PT to start on POD#3-4. Sling for  1 week and then can wean as tolerated. Progress to motion as tolerated, moving from  passive to active-assisted to active motion. For the first 4 weeks, forward flexion is limited to 100. External rotation with the arm by the side is allowed, but abduction-external rotation is not allowed for the first 6 weeks. After 6 weeks, no restrictions on motion or arm use. F/u in 2 weeks.

## 2018-10-25 ENCOUNTER — Encounter: Payer: Self-pay | Admitting: Orthopedic Surgery

## 2020-01-10 ENCOUNTER — Other Ambulatory Visit (HOSPITAL_COMMUNITY): Payer: Self-pay | Admitting: Orthopedic Surgery

## 2020-01-10 ENCOUNTER — Other Ambulatory Visit: Payer: Self-pay | Admitting: Orthopedic Surgery

## 2020-01-10 DIAGNOSIS — M25512 Pain in left shoulder: Secondary | ICD-10-CM

## 2020-01-10 DIAGNOSIS — M25511 Pain in right shoulder: Secondary | ICD-10-CM

## 2020-01-20 ENCOUNTER — Ambulatory Visit
Admission: RE | Admit: 2020-01-20 | Discharge: 2020-01-20 | Disposition: A | Discharge status: Home / Self Care | Payer: Federal, State, Local not specified - PPO | Source: Ambulatory Visit | Attending: Orthopedic Surgery | Admitting: Orthopedic Surgery

## 2020-01-20 ENCOUNTER — Other Ambulatory Visit: Payer: Self-pay

## 2020-01-20 DIAGNOSIS — M25511 Pain in right shoulder: Secondary | ICD-10-CM | POA: Diagnosis present

## 2020-01-20 DIAGNOSIS — M25512 Pain in left shoulder: Secondary | ICD-10-CM | POA: Diagnosis present

## 2020-10-22 ENCOUNTER — Other Ambulatory Visit: Payer: Self-pay | Admitting: Neurosurgery

## 2020-10-22 DIAGNOSIS — G9519 Other vascular myelopathies: Secondary | ICD-10-CM

## 2020-11-01 ENCOUNTER — Other Ambulatory Visit: Payer: Self-pay

## 2020-11-01 ENCOUNTER — Ambulatory Visit
Admission: RE | Admit: 2020-11-01 | Discharge: 2020-11-01 | Disposition: A | Discharge status: Home / Self Care | Payer: Federal, State, Local not specified - PPO | Source: Ambulatory Visit | Attending: Neurosurgery | Admitting: Neurosurgery

## 2020-11-01 DIAGNOSIS — G9519 Other vascular myelopathies: Secondary | ICD-10-CM | POA: Diagnosis not present

## 2020-11-13 ENCOUNTER — Other Ambulatory Visit: Payer: Self-pay

## 2020-11-15 ENCOUNTER — Encounter: Payer: Self-pay | Admitting: Orthopedic Surgery

## 2020-11-15 ENCOUNTER — Other Ambulatory Visit: Payer: Self-pay | Admitting: Orthopedic Surgery

## 2020-11-27 ENCOUNTER — Other Ambulatory Visit: Payer: Self-pay

## 2020-11-27 ENCOUNTER — Encounter
Admission: RE | Disposition: A | Discharge status: Home / Self Care | Payer: Self-pay | Source: Home / Self Care | Attending: Orthopedic Surgery

## 2020-11-27 ENCOUNTER — Ambulatory Visit: Payer: Federal, State, Local not specified - PPO | Admitting: Anesthesiology

## 2020-11-27 ENCOUNTER — Encounter: Payer: Self-pay | Admitting: Orthopedic Surgery

## 2020-11-27 ENCOUNTER — Ambulatory Visit
Admission: RE | Admit: 2020-11-27 | Discharge: 2020-11-27 | Disposition: A | Discharge status: Home / Self Care | Payer: Federal, State, Local not specified - PPO | Attending: Orthopedic Surgery | Admitting: Orthopedic Surgery

## 2020-11-27 DIAGNOSIS — M19012 Primary osteoarthritis, left shoulder: Secondary | ICD-10-CM | POA: Diagnosis not present

## 2020-11-27 DIAGNOSIS — M7532 Calcific tendinitis of left shoulder: Secondary | ICD-10-CM | POA: Insufficient documentation

## 2020-11-27 DIAGNOSIS — Z7951 Long term (current) use of inhaled steroids: Secondary | ICD-10-CM | POA: Diagnosis not present

## 2020-11-27 DIAGNOSIS — M25812 Other specified joint disorders, left shoulder: Secondary | ICD-10-CM | POA: Diagnosis not present

## 2020-11-27 DIAGNOSIS — Z79899 Other long term (current) drug therapy: Secondary | ICD-10-CM | POA: Insufficient documentation

## 2020-11-27 DIAGNOSIS — Z888 Allergy status to other drugs, medicaments and biological substances status: Secondary | ICD-10-CM | POA: Diagnosis not present

## 2020-11-27 DIAGNOSIS — Z885 Allergy status to narcotic agent status: Secondary | ICD-10-CM | POA: Insufficient documentation

## 2020-11-27 SURGERY — SHOULDER ARTHROSCOPY WITH SUBACROMIAL DECOMPRESSION AND DISTAL CLAVICLE EXCISION
Anesthesia: General | Site: Shoulder | Laterality: Left

## 2020-11-27 MED ORDER — OXYCODONE HCL 5 MG PO TABS: 5.0000 mg | ORAL_TABLET | Freq: Once | ORAL | Status: DC | PRN

## 2020-11-27 MED ORDER — MIDAZOLAM HCL 5 MG/5ML IJ SOLN
INTRAMUSCULAR | Status: DC | PRN
  Administered 2020-11-27: 2 mg via INTRAVENOUS

## 2020-11-27 MED ORDER — ONDANSETRON 4 MG PO TBDP: 4.0000 mg | ORAL_TABLET | Freq: Three times a day (TID) | ORAL | 0 refills | Status: AC | PRN

## 2020-11-27 MED ORDER — ONDANSETRON HCL 4 MG/2ML IJ SOLN: 4.0000 mg | Freq: Once | INTRAMUSCULAR | Status: DC | PRN

## 2020-11-27 MED ORDER — HYDROMORPHONE HCL 1 MG/ML IJ SOLN: 0.2500 mg | INTRAMUSCULAR | Status: DC | PRN

## 2020-11-27 MED ORDER — LIDOCAINE HCL (CARDIAC) PF 100 MG/5ML IV SOSY
PREFILLED_SYRINGE | INTRAVENOUS | Status: DC | PRN
  Administered 2020-11-27: 50 mg via INTRATRACHEAL

## 2020-11-27 MED ORDER — ACETAMINOPHEN 325 MG PO TABS: 325.0000 mg | ORAL_TABLET | ORAL | Status: DC | PRN

## 2020-11-27 MED ORDER — BUPIVACAINE HCL (PF) 0.5 % IJ SOLN
INTRAMUSCULAR | Status: DC | PRN
  Administered 2020-11-27: 20 mL

## 2020-11-27 MED ORDER — ACETAMINOPHEN 500 MG PO TABS: 1000.0000 mg | ORAL_TABLET | Freq: Three times a day (TID) | ORAL | 2 refills | Status: AC

## 2020-11-27 MED ORDER — HYDROCODONE-ACETAMINOPHEN 5-325 MG PO TABS: 1.0000 | ORAL_TABLET | ORAL | 0 refills | Status: AC | PRN

## 2020-11-27 MED ORDER — ASPIRIN EC 325 MG PO TBEC: 325.0000 mg | DELAYED_RELEASE_TABLET | Freq: Every day | ORAL | 0 refills | Status: AC

## 2020-11-27 MED ORDER — LACTATED RINGERS IV SOLN: INTRAVENOUS | Status: DC

## 2020-11-27 MED ORDER — FENTANYL CITRATE (PF) 100 MCG/2ML IJ SOLN
INTRAMUSCULAR | Status: DC | PRN
  Administered 2020-11-27: 100 ug via INTRAVENOUS

## 2020-11-27 MED ORDER — BUPIVACAINE LIPOSOME 1.3 % IJ SUSP: INTRAMUSCULAR | Status: DC | PRN

## 2020-11-27 MED ORDER — ONDANSETRON HCL 4 MG/2ML IJ SOLN
INTRAMUSCULAR | Status: DC | PRN
  Administered 2020-11-27: 4 mg via INTRAVENOUS

## 2020-11-27 MED ORDER — PROPOFOL 10 MG/ML IV BOLUS
INTRAVENOUS | Status: DC | PRN
  Administered 2020-11-27: 200 mg via INTRAVENOUS

## 2020-11-27 MED ORDER — OXYCODONE HCL 5 MG/5ML PO SOLN: 5.0000 mg | Freq: Once | ORAL | Status: DC | PRN

## 2020-11-27 MED ORDER — CEFAZOLIN SODIUM-DEXTROSE 2-4 GM/100ML-% IV SOLN
2.0000 g | INTRAVENOUS | Status: AC
  Administered 2020-11-27: 2 g via INTRAVENOUS

## 2020-11-27 MED ORDER — BUPIVACAINE LIPOSOME 1.3 % IJ SUSP
INTRAMUSCULAR | Status: DC | PRN
  Administered 2020-11-27: 20 mL

## 2020-11-27 MED ORDER — DEXAMETHASONE SODIUM PHOSPHATE 4 MG/ML IJ SOLN
INTRAMUSCULAR | Status: DC | PRN
  Administered 2020-11-27: 4 mg via INTRAVENOUS

## 2020-11-27 MED ORDER — LACTATED RINGERS IV SOLN
INTRAVENOUS | Status: DC | PRN
  Administered 2020-11-27: 4 mL

## 2020-11-27 MED ORDER — ACETAMINOPHEN 160 MG/5ML PO SOLN: 325.0000 mg | ORAL | Status: DC | PRN

## 2020-11-27 MED ORDER — LACTATED RINGERS IR SOLN
Status: DC | PRN
  Administered 2020-11-27: 6000 mL

## 2020-11-27 MED ORDER — BUPIVACAINE HCL (PF) 0.5 % IJ SOLN: INTRAMUSCULAR | Status: DC | PRN

## 2020-11-27 MED ORDER — GLYCOPYRROLATE 0.2 MG/ML IJ SOLN
INTRAMUSCULAR | Status: DC | PRN
  Administered 2020-11-27: .1 mg via INTRAVENOUS

## 2020-11-27 SURGICAL SUPPLY — 51 items
ADAPTER IRRIG TUBE 2 SPIKE SOL (ADAPTER) ×4 IMPLANT
ADPR TBG 2 SPK PMP STRL ASCP (ADAPTER) ×2
ANCH SUT 2.9 PUSHLOCK ANCH (Orthopedic Implant) ×1 IMPLANT
ANCHOR ICONIX SPEED 2.3 (Anchor) ×1 IMPLANT
APL PRP STRL LF DISP 70% ISPRP (MISCELLANEOUS) ×1
BUR BR 5.5 12 FLUTE (BURR) ×2 IMPLANT
BUR BR 5.5 WIDE MOUTH (BURR) ×1 IMPLANT
BUR RADIUS 4.0X18.5 (BURR) ×2 IMPLANT
CHLORAPREP W/TINT 26 (MISCELLANEOUS) ×2 IMPLANT
COOLER POLAR GLACIER W/PUMP (MISCELLANEOUS) ×2 IMPLANT
COVER LIGHT HANDLE UNIVERSAL (MISCELLANEOUS) ×4 IMPLANT
DRAPE IMP U-DRAPE 54X76 (DRAPES) ×4 IMPLANT
DRAPE INCISE IOBAN 66X45 STRL (DRAPES) ×2 IMPLANT
DRAPE U-SHAPE 48X52 POLY STRL (PACKS) ×2 IMPLANT
DRSG TEGADERM 4X4.75 (GAUZE/BANDAGES/DRESSINGS) ×7 IMPLANT
GAUZE XEROFORM 1X8 LF (GAUZE/BANDAGES/DRESSINGS) ×2 IMPLANT
GLOVE SURG ENC MOIS LTX SZ7.5 (GLOVE) ×4 IMPLANT
GOWN STRL REIN 2XL XLG LVL4 (GOWN DISPOSABLE) ×2 IMPLANT
GOWN STRL REUS W/ TWL LRG LVL3 (GOWN DISPOSABLE) ×2 IMPLANT
GOWN STRL REUS W/TWL LRG LVL3 (GOWN DISPOSABLE) ×4
IV LACTATED RINGER IRRG 3000ML (IV SOLUTION) ×12
IV LR IRRIG 3000ML ARTHROMATIC (IV SOLUTION) ×8 IMPLANT
KIT STABILIZATION SHOULDER (MISCELLANEOUS) ×2 IMPLANT
KIT TURNOVER KIT A (KITS) ×2 IMPLANT
MANIFOLD NEPTUNE II (INSTRUMENTS) ×2 IMPLANT
MASK FACE SPIDER DISP (MASK) ×2 IMPLANT
MAT GRAY ABSORB FLUID 28X50 (MISCELLANEOUS) ×4 IMPLANT
NDL MAYO CATGUT SZ5 (NEEDLE) ×2
NDL SAFETY ECLIPSE 18X1.5 (NEEDLE) ×1 IMPLANT
NDL SUT 5 .5 CRC TPR PNT MAYO (NEEDLE) IMPLANT
NEEDLE HYPO 18GX1.5 SHARP (NEEDLE) ×2
PACK ARTHROSCOPY SHOULDER (MISCELLANEOUS) ×2 IMPLANT
PAD ABD DERMACEA PRESS 5X9 (GAUZE/BANDAGES/DRESSINGS) ×1 IMPLANT
PAD WRAPON POLAR SHDR XLG (MISCELLANEOUS) ×1 IMPLANT
PASSER SUT FIRSTPASS SELF (INSTRUMENTS) ×1 IMPLANT
SUT ETHILON 3-0 FS-10 30 BLK (SUTURE) ×2
SUT MNCRL 4-0 (SUTURE) ×2
SUT MNCRL 4-0 27XMFL (SUTURE) ×1
SUT VIC AB 0 CT1 36 (SUTURE) ×1 IMPLANT
SUT VIC AB 2-0 CT2 27 (SUTURE) ×2 IMPLANT
SUTURE EHLN 3-0 FS-10 30 BLK (SUTURE) IMPLANT
SUTURE MNCRL 4-0 27XMF (SUTURE) IMPLANT
SUTURETAPE 1.3 40 W/NDL BLK/WH (SUTURE) ×1 IMPLANT
SUTURETAPE 1.3 40 W/NDL BLUE (SUTURE) ×1 IMPLANT
SYR 10ML LL (SYRINGE) ×2 IMPLANT
SYSTEM IMPL TENODESIS LNT 2.9 (Orthopedic Implant) ×1 IMPLANT
TAPE MICROFOAM 4IN (TAPE) ×2 IMPLANT
TUBING INFLOW SET DBFLO PUMP (TUBING) ×1 IMPLANT
TUBING OUTFLOW SET DBLFO PUMP (TUBING) ×1 IMPLANT
WAND WEREWOLF FLOW 90D (MISCELLANEOUS) ×3 IMPLANT
WRAPON POLAR PAD SHDR XLG (MISCELLANEOUS) ×2

## 2020-11-27 NOTE — Discharge Instructions (Addendum)
Post-Op Instructions - Rotator Cuff Repair  1. Bracing: You will wear a shoulder immobilizer or sling for 4 weeks.   2. Driving: No driving for 4 weeks post-op.  3. Activity: No active lifting for 2 months. Wrist, hand, and elbow motion only. Avoid lifting the upper arm away from the body except for hygiene. You are permitted to bend and straighten the elbow passively only (no active elbow motion). You may use your hand and wrist for typing, writing, and managing utensils (cutting food). Do not lift more than a coffee cup for 8 weeks.  When sleeping or resting, inclined positions (recliner chair or wedge pillow) and a pillow under the forearm for support may provide better comfort for up to 4 weeks.  Avoid long distance travel for 4 weeks.  Return to normal activities after rotator cuff repair repair normally takes 6 months on average. If rehab goes very well, may be able to do most activities at 4 months, except overhead or contact sports.  4. Physical Therapy: Begins 3-7 days after surgery, and proceed 1 time per week for the first 6 weeks, then 1-2 times per week from weeks 6-20 post-op.  5. Medications:  - You will be provided a prescription for narcotic pain medicine. After surgery, take 1-2 narcotic tablets every 4 hours if needed for severe pain.  - A prescription for anti-nausea medication will be provided in case the narcotic medicine causes nausea - take 1 tablet every 6 hours only if nauseated.   - Take tylenol 1000 mg (2 Extra Strength tablets or 3 regular strength) every 8 hours for pain.  May decrease or stop tylenol 5 days after surgery if you are having minimal pain. - Take ASA 325mg /day x 2 weeks to help prevent DVTs/PEs (blood clots).  - DO NOT take ANY nonsteroidal anti-inflammatory pain medications (Advil, Motrin, Ibuprofen, Aleve, Naproxen, or Naprosyn). These medicines can inhibit healing of your shoulder repair.    If you are taking prescription medication for anxiety,  depression, insomnia, muscle spasm, chronic pain, or for attention deficit disorder, you are advised that you are at a higher risk of adverse effects with use of narcotics post-op, including narcotic addiction/dependence, depressed breathing, death. If you use non-prescribed substances: alcohol, marijuana, cocaine, heroin, methamphetamines, etc., you are at a higher risk of adverse effects with use of narcotics post-op, including narcotic addiction/dependence, depressed breathing, death. You are advised that taking > 50 morphine milligram equivalents (MME) of narcotic pain medication per day results in twice the risk of overdose or death. For your prescription provided: oxycodone 5 mg - taking more than 6 tablets per day would result in > 50 morphine milligram equivalents (MME) of narcotic pain medication. Be advised that we will prescribe narcotics short-term, for acute post-operative pain only - 3 weeks for major operations such as shoulder repair/reconstruction surgeries.     6. Post-Op Appointment:  Your first post-op appointment will be 10-14 days post-op.  7. Work or School: For most, but not all procedures, we advise staying out of work or school for at least 1 to 2 weeks in order to recover from the stress of surgery and to allow time for healing.   If you need a work or school note this can be provided.   8. Smoking: If you are a smoker, you need to refrain from smoking in the postoperative period. The nicotine in cigarettes will inhibit healing of your shoulder repair and decrease the chance of successful repair. Similarly, nicotine containing products (  gum, patches) should be avoided.   Post-operative Brace: Apply and remove the brace you received as you were instructed to at the time of fitting and as described in detail as the brace's instructions for use indicate.  Wear the brace for the period of time prescribed by your physician.  The brace can be cleaned with soap and water and  allowed to air dry only.  Should the brace result in increased pain, decreased feeling (numbness/tingling), increased swelling or an overall worsening of your medical condition, please contact your doctor immediately.  If an emergency situation occurs as a result of wearing the brace after normal business hours, please dial 911 and seek immediate medical attention.  Let your doctor know if you have any further questions about the brace issued to you. Refer to the shoulder sling instructions for use if you have any questions regarding the correct fit of your shoulder sling.  [facility] Customer Care for Troubleshooting: [phone]  Video that illustrates how to properly use a shoulder sling: "Instructions for Proper Use of an Orthopaedic Sling" http://bass.com/    Information for Discharge Teaching: EXPAREL (bupivacaine liposome injectable suspension)   Your surgeon or anesthesiologist gave you EXPAREL(bupivacaine) to help control your pain after surgery.  EXPAREL is a local anesthetic that provides pain relief by numbing the tissue around the surgical site. EXPAREL is designed to release pain medication over time and can control pain for up to 72 hours. Depending on how you respond to EXPAREL, you may require less pain medication during your recovery.  Possible side effects: Temporary loss of sensation or ability to move in the area where bupivacaine was injected. Nausea, vomiting, constipation Rarely, numbness and tingling in your mouth or lips, lightheadedness, or anxiety may occur. Call your doctor right away if you think you may be experiencing any of these sensations, or if you have other questions regarding possible side effects.  Follow all other discharge instructions given to you by your surgeon or nurse. Eat a healthy diet and drink plenty of water or other fluids.  If you return to the hospital for any reason within 96 hours following the administration of  EXPAREL, it is important for health care providers to know that you have received this anesthetic. A teal colored band has been placed on your arm with the date, time and amount of EXPAREL you have received in order to alert and inform your health care providers. Please leave this armband in place for the full 96 hours following administration, and then you may remove the band.    POLAR CARE INFORMATION  MassAdvertisement.it  How to use [employer] Polar Care [facility]?  YouTube   ShippingScam.co.uk  OPERATING INSTRUCTIONS  Start the product With dry hands, connect the transformer to the electrical connection located on the top of the cooler. Next, plug the transformer into an appropriate electrical outlet. The unit will automatically start running at this point.  To stop the pump, disconnect electrical power.  Unplug to stop the product when not in use. Unplugging the Polar Care unit turns it off. Always unplug immediately after use. Never leave it plugged in while unattended. Remove pad.    FIRST ADD WATER TO FILL LINE, THEN ICE---Replace ice when existing ice is almost melted  1 Discuss Treatment with your Licensed Health Care Practitioner and Use Only as Prescribed 2 Apply Insulation Barrier & Cold Therapy Pad 3 Check for Moisture 4 Inspect Skin Regularly  Tips and Trouble Shooting Usage Tips 1.  Use cubed or chunked ice for optimal performance. 2. It is recommended to drain the Pad between uses. To drain the pad, hold the Pad upright with the hose pointed toward the ground. Depress the black plunger and allow water to drain out. 3. You may disconnect the Pad from the unit without removing the pad from the affected area by depressing the silver tabs on the hose coupling and gently pulling the hoses apart. The Pad and unit will seal itself and will not leak. Note: Some dripping during release is normal. 4. DO NOT RUN PUMP WITHOUT WATER! The pump in this unit  is designed to run with water. Running the unit without water will cause permanent damage to the pump. 5. Unplug unit before removing lid.  TROUBLESHOOTING GUIDE Pump not running, Water not flowing to the pad, Pad is not getting cold 1. Make sure the transformer is plugged into the wall outlet. 2. Confirm that the ice and water are filled to the indicated levels. 3. Make sure there are no kinks in the pad. 4. Gently pull on the blue tube to make sure the tube/pad junction is straight. 5. Remove the pad from the treatment site and ll it while the pad is lying at; then reapply. 6. Confirm that the pad couplings are securely attached to the unit. Listen for the double clicks (Figure 1) to confirm the pad couplings are securely attached.  Leaks    Note: Some condensation on the lines, controller, and pads is unavoidable, especially in warmer climates. 1. If using a [employer] Polar Care Cold Therapy unit with a detachable Cold Therapy Pad, and a leak exists (other than condensation on the lines) disconnect the pad couplings. Make sure the silver tabs on the couplings are depressed before reconnecting the pad to the pump hose; then confirm both sides of the coupling are properly clicked in. 2. If the coupling continues to leak or a leak is detected in the pad itself, stop using it and call [employer] Customer Care at [phone].  Cleaning After use, empty and dry the unit with a soft cloth. Warm water and mild detergent may be used occasionally to clean the pump and tubes.  WARNING: The Polar Care Cube can be cold enough to cause serious injury, including full skin necrosis. Follow these Operating Instructions, and carefully read the Product Insert (see pouch on side of unit) and the Cold Therapy Pad Fitting Instructions (provided with each Cold Therapy Pad) prior to use.

## 2020-11-27 NOTE — Op Note (Addendum)
SURGERY DATE: 11/27/2020  PRE-OP DIAGNOSIS:  1. Left subacromial impingement 2. Left biceps tendinopathy 3. Left shoulder calcific tendinitis 4. Left acromioclavicular joint osteoarthritis  POST-OP DIAGNOSIS: 1. Left subacromial impingement 2. Left biceps tendinopathy 3. Left shoulder calcific tendinitis 4. Left acromioclavicular joint osteoarthritis  PROCEDURES:  1. Left mini-open revision rotator cuff repair after calcific tendinitis excision 2. Left arthroscopic biceps tenodesis 3. Left arthroscopic subacromial decompression 4. Left arthroscopic extensive debridement of shoulder (glenohumeral and subacromial spaces) 5. Left arthroscopic distal clavicle excision  SURGEON: Rosealee Albee, MD  ASSISTANT:  Sonny Dandy, PA  ANESTHESIA: Gen with Exparel interscalene block  ESTIMATED BLOOD LOSS: 25cc  DRAINS:  none  TOTAL IV FLUIDS: per anesthesia   SPECIMENS: none  IMPLANTS:  - Arthrex 2.9 mm PushLock anchor x1 - Iconix SPEED double loaded with 1.2 and 2.21mm tape x 1   OPERATIVE FINDINGS:  Examination under anesthesia: A careful examination under anesthesia was performed.  Passive range of motion was: FF: 150; ER at side: 40; ER in abduction: 85; IR in abduction: 40.  Anterior load shift: NT.  Posterior load shift: NT.  Sulcus in neutral: NT.  Sulcus in ER: NT.    Intra-operative findings: A thorough arthroscopic examination of the shoulder was performed.  The findings are: 1. Biceps tendon: Split thickness tearing of the intra-articular portion; prominent adhesion from biceps to posterosuperior labrum 2. Superior labrum: injected with surrounding synovitis, type II SLAP tear 3. Posterior labrum and capsule: normal 4. Inferior capsule and inferior recess: normal 5. Glenoid cartilage surface: Areas of grade 1-2 degenerative changes 6. Supraspinatus attachment: Attachment itself intact with sutures from prior repair visible on the articular side 7. Posterior rotator cuff  attachment: normal 8. Humeral head articular cartilage: Small humeral head osteophyte inferiorly with areas of grade 1-2 degenerative changes 9. Rotator interval: significant synovitis 10: Subscapularis tendon: attachment intact 11. Anterior labrum: degenerative 12. IGHL: normal  OPERATIVE REPORT:   Indications for procedure: Scott Fletcher is a 60 y.o. male with over 1 year of L shoulder pain that has failed non-operative management including activity modification, physical therapy, medical management and corticosteroid injection without adequate relief of symptoms. Clinical exam and imaging were suggestive of rotator cuff calcific tendinitis, biceps tendinopathy, subacromial impingement, and acromioclavicular joint arthritis.  Of note, he has had prior rotator cuff repair on the left shoulder in May 2013.  After discussion of risks, benefits, and alternatives to surgery, the patient elected to proceed.   Procedure in detail:  I identified Scott Fletcher in the pre-operative holding area.  I marked the operative shoulder with my initials. I reviewed the risks and benefits of the proposed surgical intervention, and the patient (and/or patient's guardian) wished to proceed.  Anesthesia was then performed with an Exparel interscalene block.  The patient was transferred to the operative suite and placed in the beach chair position.    SCDs were placed on the lower extremities. Appropriate IV antibiotics were administered prior to incision. The operative upper extremity was then prepped and draped in standard fashion. A time out was performed confirming the correct extremity, correct patient, and correct procedure.   I then created a standard posterior portal with an 11 blade. The glenohumeral joint was easily entered with a blunt trochar and the arthroscope introduced. The findings of diagnostic arthroscopy are described above. I debrided degenerative tissue including the synovitic tissue about the  rotator interval and anterior and superior labrum.  Adhesion from the biceps to the labrum  was debrided with an oscillating shaver.  I then coagulated the inflamed synovium to obtain hemostasis and reduce the risk of post-operative swelling using an Arthrocare radiofrequency device.   I then turned my attention to the arthroscopic biceps tenodesis. The Loop n Tack technique was used to pass a FiberTape through the biceps in a locked fashion adjacent to the biceps anchor.  A hole for a 2.9 mm Arthrex PushLock was drilled in the bicipital groove just superior to the subscapularis tendon insertion.  The biceps tendon was then cut and the biceps anchor complex was debrided down to a stable base on the superior labrum.  The FiberTape was loaded onto the PushLock anchor and impacted into place into the previously drilled hole in the bicipital groove.  This appropriately secured the biceps into the bicipital groove and took it off of tension.  Next, the arthroscope was then introduced into the subacromial space. A direct lateral portal was created with an 11-blade after spinal needle localization.  There was a severe amount of bursitis and adhesions within the subacromial space.  An extensive subacromial bursectomy and debridement was performed using a combination of the shaver and Arthrocare wand. The entire acromial undersurface was exposed and the CA ligament was subperiosteally elevated to expose the anterior acromial hook. A 5.67mm barrel burr was used to create a flat anterior and lateral aspect of the acromion, converting it from a Type 2 to a Type 1 acromion. Care was made to keep the deltoid fascia intact.  I then turned my attention to the arthroscopic distal clavicle excision.  I identified the acromioclavicular joint.  Surrounding bursal tissue was debrided and the edges of the joint were identified. It appeared that he may have had a prior distal clavicle excision, but only 8 mm of space was present  between the acromion and distal clavicle.  I used the 5.60mm barrel burr to remove the distal clavicle parallel to the edge of the acromion. I was able to fit two widths of the burr into the space between the distal clavicle and acromion, signifying that I had removed ~27mm of distal clavicle. This was confirmed by viewing anteriorly and introducing a probe with measuring marks from the lateral portal. Hemostasis was achieved with an Arthrocare wand. Fluid was evacuated from the shoulder.   A longitudinal incision from the anterolateral acromion ~7cm in length was made overlying the raphe between the anterior and middle heads of the deltoid. The raphe was identified and it was incised. The subacromial space was identified. Any remaining bursa was excised. The rotator cuff tears were identified.  There were 2 tears of the musculotendinous junction medial to the rotator cuff insertion.  One was located anteriorly and another was located posteriorly.  These were repaired with figure of 8 stitches in a side-to-side fashion using SutureTape both anteriorly and posteriorly.  The rotator cuff footprint itself was intact  Next, 2 calcific deposits were identified underlying the rotator cuff.  These were removed sharply using Metzenbaum scissors and a 15 blade, taking care to leave as much intact rotator cuff as possible.  The first deposit measured 7 x 5 mm, the second deposit measured 10 x 10 mm. After excision, there was a small, approximately 5 mm full-thickness tear of the rotator cuff at the level of the articular margin. The rotator cuff footprint was cleared of soft tissue and prior suture using a rongeur. An Iconix SPEED anchor was placed at the articular margin.  Both sets of sutures were  passed in a side-to-side fashion overlying the full-thickness defect and tied appropriately reducing the rotator cuff to the anchor and the rotator cuff tear itself. This construct was stable with external and internal rotation  without any notable full-thickness or high-grade partial-thickness defects.  The wound was thoroughly irrigated.  The deltoid split was closed with 0 Vicryl.  The subdermal layer was closed with 2-0 Vicryl.  The skin was closed with 4-0 Monocryl and Dermabond. The portals were closed with 3-0 Nylon. Xeroform was applied to the incisions. A sterile dressing was applied, followed by a Polar Care sleeve and a SlingShot shoulder immobilizer/sling. The patient was awakened from anesthesia without difficulty and was transferred to the PACU in stable condition.   Of note, assistance from a PA was essential to performing the surgery.  PA was present for the entire surgery.  PA assisted with patient positioning, retraction, instrumentation, and wound closure. The surgery would have been more difficult and had longer operative time without PA assistance.    COMPLICATIONS: none  DISPOSITION: plan for discharge home after recovery in PACU   POSTOPERATIVE PLAN: Remain in sling (except hygiene and elbow/wrist/hand RoM exercises as instructed by PT) x 4 weeks and NWB for this time. PT to begin 3-4 days after surgery.  Use small/medium rotator cuff repair rehab protocol. ASA 325mg  daily x 2 weeks for DVT ppx.

## 2020-11-27 NOTE — H&P (Signed)
Paper H&P to be scanned into permanent record. H&P reviewed. No significant changes noted.  

## 2020-11-27 NOTE — Anesthesia Preprocedure Evaluation (Signed)
Anesthesia Evaluation  Patient identified by MRN, date of birth, ID band Patient awake    Reviewed: Allergy & Precautions, H&P , NPO status , Patient's Chart, lab work & pertinent test results, reviewed documented beta blocker date and time   Airway Mallampati: I  TM Distance: >3 FB Neck ROM: full    Dental no notable dental hx.    Pulmonary asthma , sleep apnea ,  "Does not tolerate CPAP"   Pulmonary exam normal breath sounds clear to auscultation       Cardiovascular Exercise Tolerance: Good negative cardio ROS Normal cardiovascular exam Rhythm:regular Rate:Normal     Neuro/Psych negative neurological ROS  negative psych ROS   GI/Hepatic negative GI ROS, Neg liver ROS,   Endo/Other  negative endocrine ROS  Renal/GU negative Renal ROS  negative genitourinary   Musculoskeletal   Abdominal   Peds  Hematology negative hematology ROS (+)   Anesthesia Other Findings   Reproductive/Obstetrics negative OB ROS                             Anesthesia Physical Anesthesia Plan  ASA: 2  Anesthesia Plan: General   Post-op Pain Management: GA combined w/ Regional for post-op pain   Induction:   PONV Risk Score and Plan:   Airway Management Planned:   Additional Equipment:   Intra-op Plan:   Post-operative Plan:   Informed Consent: I have reviewed the patients History and Physical, chart, labs and discussed the procedure including the risks, benefits and alternatives for the proposed anesthesia with the patient or authorized representative who has indicated his/her understanding and acceptance.     Dental Advisory Given  Plan Discussed with: CRNA and Anesthesiologist  Anesthesia Plan Comments:         Anesthesia Quick Evaluation

## 2020-11-27 NOTE — Anesthesia Procedure Notes (Signed)
Anesthesia Regional Block: Interscalene brachial plexus block   Pre-Anesthetic Checklist: , timeout performed,  Correct Patient, Correct Site, Correct Laterality,  Correct Procedure, Correct Position, site marked,  Risks and benefits discussed,  Surgical consent,  Pre-op evaluation,  At surgeon's request and post-op pain management  Laterality: Left  Prep: chloraprep       Needles:  Injection technique: Single-shot  Needle Type: Stimiplex     Needle Length: 5cm  Needle Gauge: 22     Additional Needles:   Procedures:,,,, ultrasound used (permanent image in chart),,    Narrative:  Start time: 11/27/2020 9:02 AM End time: 11/27/2020 9:09 AM Injection made incrementally with aspirations every 5 mL.  Performed by: Personally  Anesthesiologist: Baxter Flattery, MD  Additional Notes: Functioning IV was confirmed and monitors applied. Ultrasound guidance: relevant anatomy identified, needle position confirmed, local anesthetic spread visualized around nerve(s)., vascular puncture avoided.  Image printed for medical record.  Negative aspiration and no paresthesias; incremental administration of local anesthetic. The patient tolerated the procedure well. Vitals signes recorded in RN notes.

## 2020-11-27 NOTE — Transfer of Care (Signed)
Immediate Anesthesia Transfer of Care Note  Patient: Scott Fletcher  Procedure(s) Performed: Left shoulder arthroscopy, glenohumeral  debridement, biceps tenodesis, distal clavicle excision, subacromial decompression, and calcific tendinitis excision with possible mini-open rotator cuff repair (Left)  Patient Location: PACU  Anesthesia Type: General  Level of Consciousness: awake, alert  and patient cooperative  Airway and Oxygen Therapy: Patient Spontanous Breathing and Patient connected to supplemental oxygen  Post-op Assessment: Post-op Vital signs reviewed, Patient's Cardiovascular Status Stable, Respiratory Function Stable, Patent Airway and No signs of Nausea or vomiting  Post-op Vital Signs: Reviewed and stable  Complications: No notable events documented.

## 2020-11-27 NOTE — Anesthesia Procedure Notes (Signed)
Procedure Name: LMA Insertion Date/Time: 11/27/2020 10:16 AM Performed by: Jimmy Picket, CRNA Pre-anesthesia Checklist: Patient identified, Emergency Drugs available, Suction available, Timeout performed and Patient being monitored Patient Re-evaluated:Patient Re-evaluated prior to induction Oxygen Delivery Method: Circle system utilized Preoxygenation: Pre-oxygenation with 100% oxygen Induction Type: IV induction LMA: LMA inserted LMA Size: 4.0 Number of attempts: 1 Placement Confirmation: positive ETCO2 and breath sounds checked- equal and bilateral Tube secured with: Tape

## 2020-11-27 NOTE — Anesthesia Postprocedure Evaluation (Signed)
Anesthesia Post Note  Patient: Scott Fletcher  Procedure(s) Performed: Arthroscopic bicept tenodesis; mini-open rotator cuff repair; subcromial decompression; distal clavicle excision; colcific tendinitis excision (Left: Shoulder)     Patient location during evaluation: PACU Anesthesia Type: General Level of consciousness: awake and alert Pain management: pain level controlled Vital Signs Assessment: post-procedure vital signs reviewed and stable Respiratory status: spontaneous breathing, nonlabored ventilation, respiratory function stable and patient connected to nasal cannula oxygen Cardiovascular status: blood pressure returned to baseline and stable Postop Assessment: no apparent nausea or vomiting Anesthetic complications: no   No notable events documented.  Alta Corning

## 2022-03-28 IMAGING — MR MR LUMBAR SPINE W/O CM
5 series · 30 of 48 positions shown · non-contrast
Comparison: None.

CLINICAL DATA: No known injury, bilateral buttock pain and
posterior thigh pain.

EXAM:
MRI LUMBAR SPINE WITHOUT CONTRAST
TECHNIQUE: Multiplanar, multisequence MR imaging of the lumbar spine was
performed. No intravenous contrast was administered.

[Series 5: T2 · sagittal · 4.0mm · 0.88mm/px · 6 of 17 slices shown (1 of 2)]
[im 1/17]
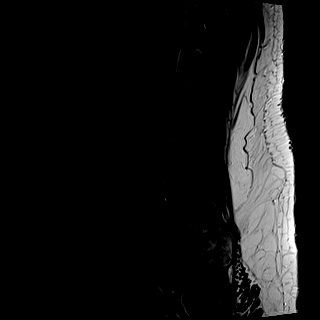
[im 4/17]
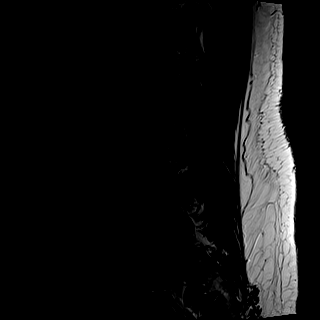
[im 7/17]
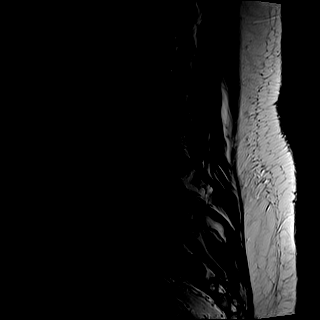
[im 10/17]
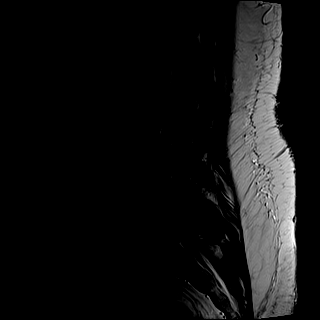
[im 13/17]
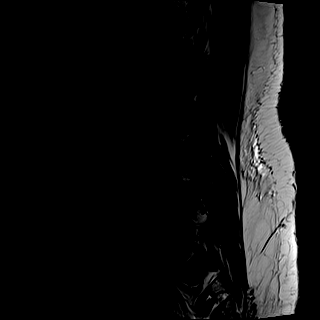
[im 17/17]
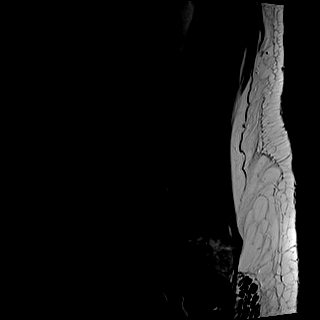

[Series 6: T1 · sagittal · 4.0mm · 0.88mm/px · 7 of 17 slices shown (1 of 2)]
[im 1/17]
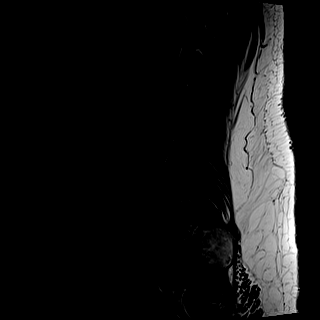
[im 3/17]
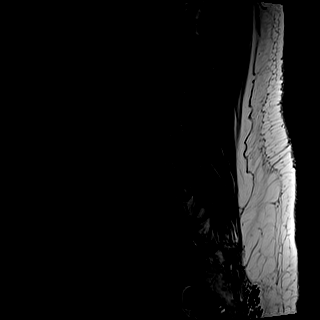
[im 6/17]
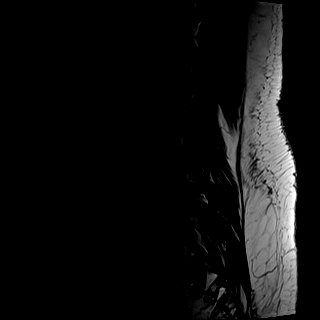
[im 9/17]
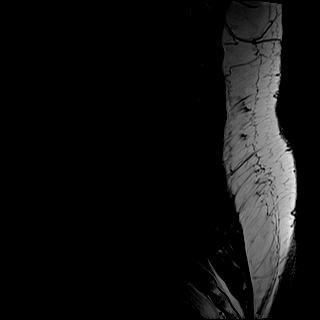
[im 11/17]
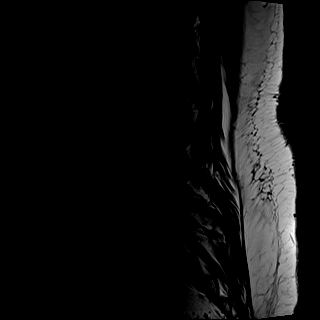
[im 14/17]
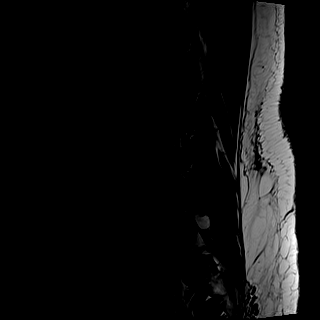
[im 17/17]
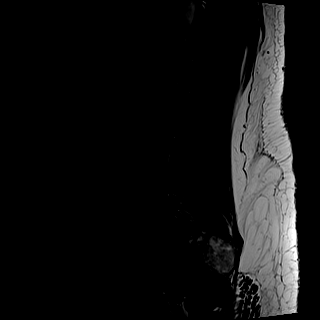

[Series 7: STIR · sagittal · 4.0mm · 0.44mm/px · 1 of 17 slices shown]
[im 1/17]
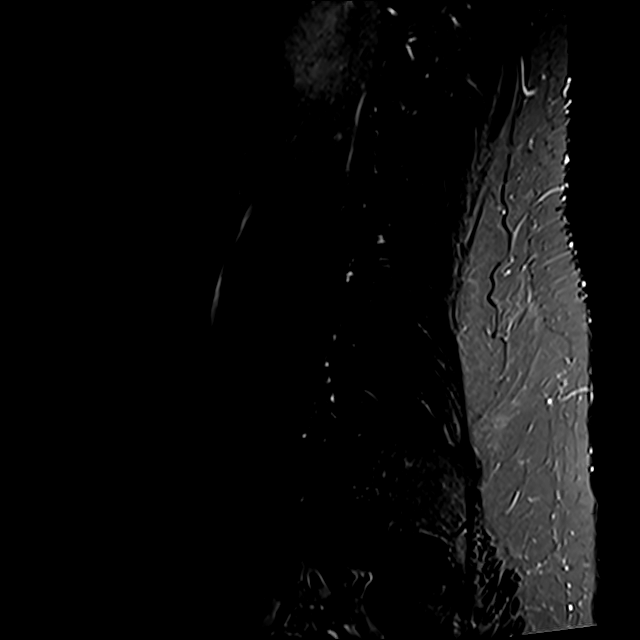

[Series 8: T2 · axial · 4.0mm · 0.78mm/px · z∈[-14,+189]mm · 8 of 36 slices shown (2 of 2)]
[im 1/36]
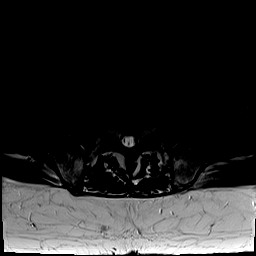
[im 6/36]
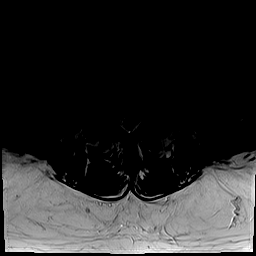
[im 11/36]
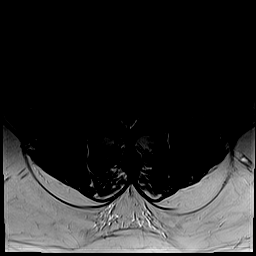
[im 17/36]
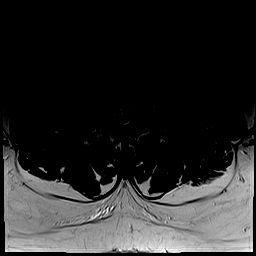
[im 19/36]
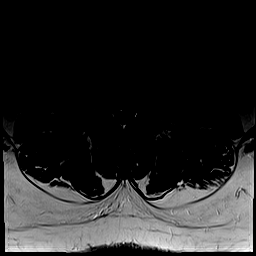
[im 25/36]
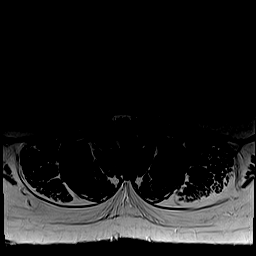
[im 30/36]
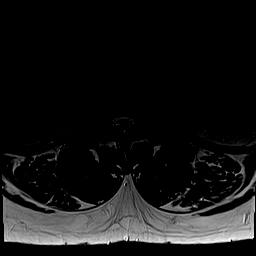
[im 36/36]
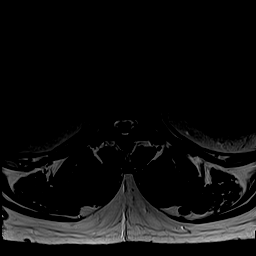

[Series 9: T1 · axial · 4.0mm · 0.39mm/px · z∈[-14,+189]mm · 8 of 36 slices shown (2 of 2)]
[im 1/36]
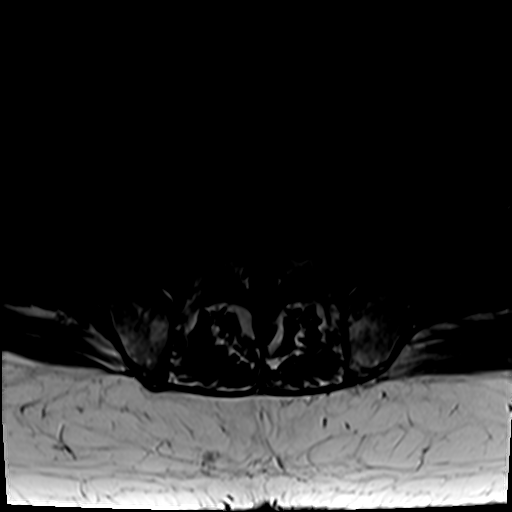
[im 6/36]
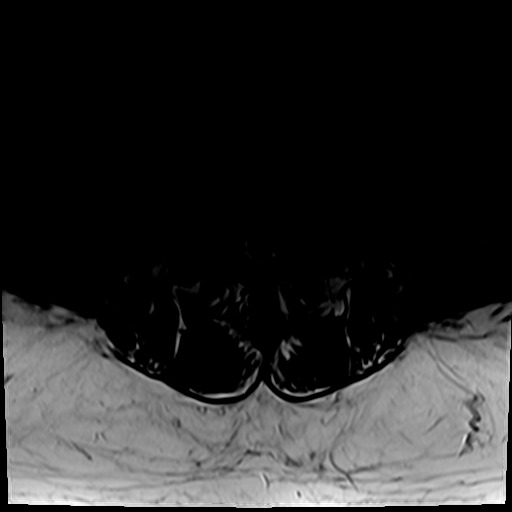
[im 11/36]
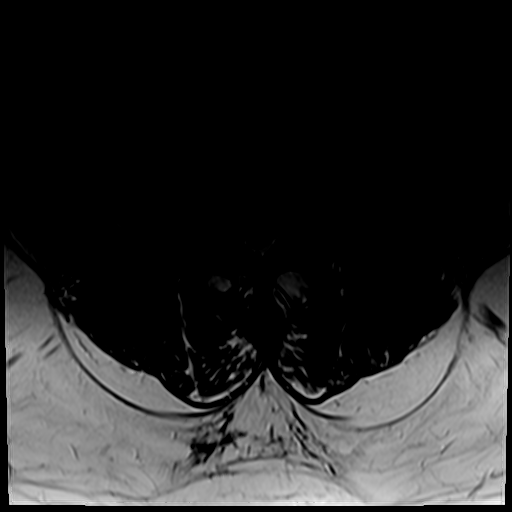
[im 17/36]
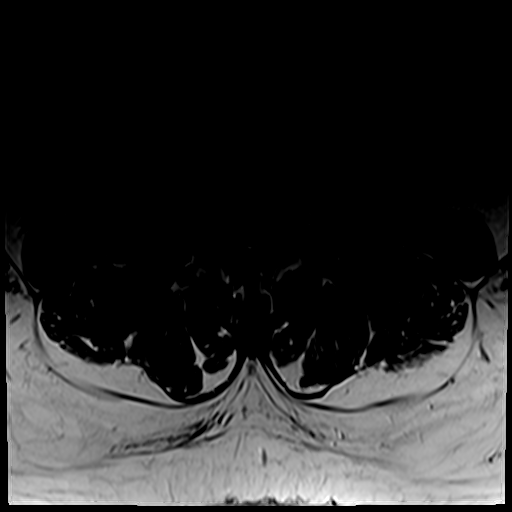
[im 19/36]
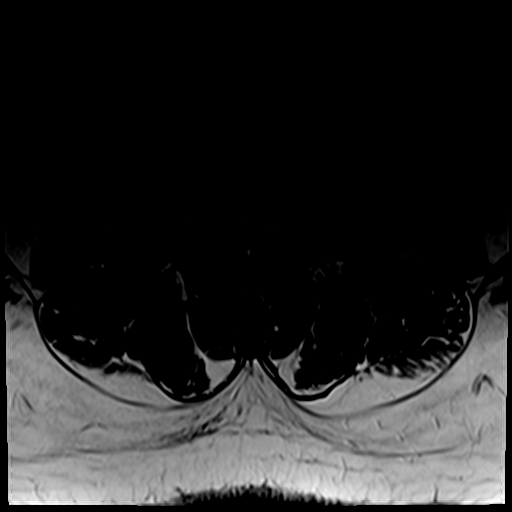
[im 25/36]
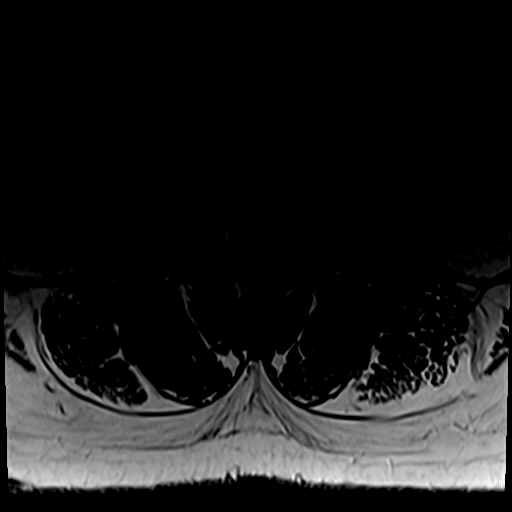
[im 30/36]
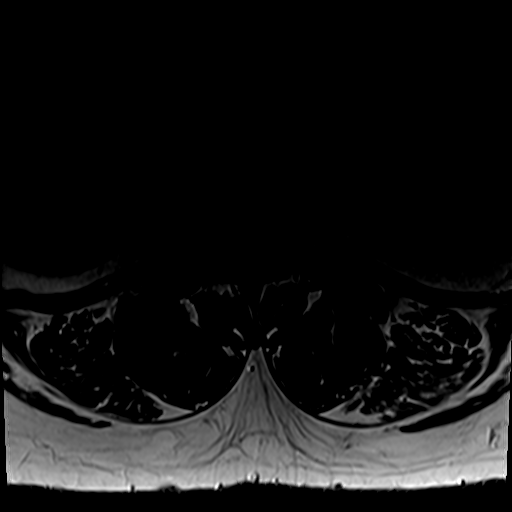
[im 36/36]
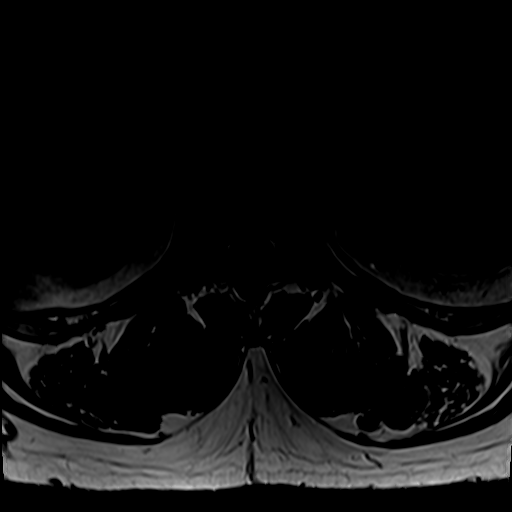

[30 of 48 positions shown; findings below may reference images not displayed]

FINDINGS: Segmentation:  Standard.

Alignment: Minimal grade 1 anterolisthesis of L4 on L5 secondary to
facet disease.

Vertebrae: No acute fracture, evidence of discitis, or aggressive
bone lesion. Mild marrow edema in the L4 and L5 pedicles likely
reflecting mild stress reaction.

Conus medullaris and cauda equina: Conus extends to the L1 level.
Conus and cauda equina appear normal.

Paraspinal and other soft tissues: No acute paraspinal abnormality.

Other: Osteoarthritis of bilateral sacroiliac joints with anterior
bridging osteophytes.

Disc levels:

Disc spaces: Disc desiccation at L2-3, L3-4, L4-5 and L5-S1. Disc
height loss at L5-S1 and to lesser extent L2-3. overall the spinal
canal is developmentally narrow secondary to short pedicles.

T12-L1: No significant disc bulge. No neural foraminal stenosis. No
central canal stenosis. Mild bilateral facet arthropathy.

L1-L2: No significant disc bulge. No neural foraminal stenosis. No
central canal stenosis. Mild bilateral facet arthropathy.

L2-L3: Broad-based disc bulge. Mild bilateral facet arthropathy.
Moderate-severe spinal stenosis mild bilateral foraminal stenosis.

L3-L4: Broad-based disc bulge. Moderate bilateral facet arthropathy.
Prominence of the posterior epidural fat. Severe spinal stenosis.
Moderate bilateral foraminal stenosis.

L4-L5: Broad-based disc bulge eccentric towards the right. Severe
left and moderate right facet arthropathy with ligamentum flavum
infolding. Prominence of the posterior epidural fat. Severe spinal
stenosis. Moderate right and mild left foraminal stenosis.

L5-S1: Broad-based disc bulge with a broad central disc protrusion.
Bilateral subarticular recess stenosis. Moderate bilateral facet
arthropathy. Moderate-severe bilateral foraminal stenosis.
IMPRESSION: 1. Lumbar spine spondylosis as described above.
2. No acute osseous injury of the lumbar spine.
3. Mild marrow edema in the L4 and L5 pedicles likely reflecting
mild stress reaction.
4. Osteoarthritis of bilateral SI joints.
